# Patient Record
Sex: Male | Born: 1950 | Race: White | Hispanic: No | Marital: Married | Smoking: Former smoker
Health system: Southern US, Community
[De-identification: ages and names within clinical notes are randomized; demographics above are authoritative.]

## PROBLEM LIST (undated history)

## (undated) DIAGNOSIS — M1990 Unspecified osteoarthritis, unspecified site: Secondary | ICD-10-CM

## (undated) DIAGNOSIS — I2510 Atherosclerotic heart disease of native coronary artery without angina pectoris: Secondary | ICD-10-CM

## (undated) DIAGNOSIS — I10 Essential (primary) hypertension: Secondary | ICD-10-CM

## (undated) DIAGNOSIS — I739 Peripheral vascular disease, unspecified: Secondary | ICD-10-CM

## (undated) DIAGNOSIS — M549 Dorsalgia, unspecified: Secondary | ICD-10-CM

## (undated) DIAGNOSIS — J42 Unspecified chronic bronchitis: Secondary | ICD-10-CM

## (undated) DIAGNOSIS — M109 Gout, unspecified: Secondary | ICD-10-CM

## (undated) DIAGNOSIS — E785 Hyperlipidemia, unspecified: Secondary | ICD-10-CM

## (undated) DIAGNOSIS — R0602 Shortness of breath: Secondary | ICD-10-CM

## (undated) DIAGNOSIS — I209 Angina pectoris, unspecified: Secondary | ICD-10-CM

## (undated) DIAGNOSIS — I4510 Unspecified right bundle-branch block: Secondary | ICD-10-CM

## (undated) DIAGNOSIS — N400 Enlarged prostate without lower urinary tract symptoms: Secondary | ICD-10-CM

## (undated) HISTORY — DX: Dorsalgia, unspecified: M54.9

## (undated) HISTORY — DX: Hyperlipidemia, unspecified: E78.5

## (undated) HISTORY — PX: CERVICAL DISC SURGERY: SHX588

## (undated) HISTORY — DX: Essential (primary) hypertension: I10

## (undated) HISTORY — PX: TONSILLECTOMY: SUR1361

---

## 1974-10-12 HISTORY — PX: KNEE SURGERY: SHX244

## 1999-06-13 HISTORY — PX: INGUINAL HERNIA REPAIR: SUR1180

## 2002-01-31 ENCOUNTER — Ambulatory Visit (HOSPITAL_COMMUNITY): Admission: RE | Admit: 2002-01-31 | Discharge: 2002-02-01 | Payer: Self-pay | Admitting: Neurological Surgery

## 2002-01-31 ENCOUNTER — Encounter: Payer: Self-pay | Admitting: Neurological Surgery

## 2012-02-05 ENCOUNTER — Emergency Department (HOSPITAL_COMMUNITY)
Admission: EM | Admit: 2012-02-05 | Discharge: 2012-02-06 | Disposition: A | Discharge status: Home / Self Care | Payer: PRIVATE HEALTH INSURANCE | Attending: Emergency Medicine | Admitting: Emergency Medicine

## 2012-02-05 ENCOUNTER — Encounter (HOSPITAL_COMMUNITY): Payer: Self-pay | Admitting: *Deleted

## 2012-02-05 DIAGNOSIS — Y9229 Other specified public building as the place of occurrence of the external cause: Secondary | ICD-10-CM | POA: Insufficient documentation

## 2012-02-05 DIAGNOSIS — W208XXA Other cause of strike by thrown, projected or falling object, initial encounter: Secondary | ICD-10-CM | POA: Insufficient documentation

## 2012-02-05 DIAGNOSIS — S0101XA Laceration without foreign body of scalp, initial encounter: Secondary | ICD-10-CM

## 2012-02-05 DIAGNOSIS — S0100XA Unspecified open wound of scalp, initial encounter: Secondary | ICD-10-CM | POA: Insufficient documentation
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
## 2012-02-06 MED ORDER — IBUPROFEN 800 MG PO TABS: 800.0000 mg | ORAL_TABLET | Freq: Three times a day (TID) | ORAL | Status: AC

## 2012-02-06 MED ORDER — TETANUS-DIPHTH-ACELL PERTUSSIS 5-2.5-18.5 LF-MCG/0.5 IM SUSP
0.5000 mL | Freq: Once | INTRAMUSCULAR | Status: AC
  Administered 2012-02-06: 0.5 mL via INTRAMUSCULAR
  Filled 2012-02-06: qty 0.5

## 2012-02-06 MED ORDER — IBUPROFEN 800 MG PO TABS
800.0000 mg | ORAL_TABLET | Freq: Once | ORAL | Status: AC
  Administered 2012-02-06: 800 mg via ORAL
  Filled 2012-02-06: qty 1

## 2012-02-06 NOTE — ED Notes (Signed)
Rx x 1, pt voiced understanding to have staples removed in 7-10 days.
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
## 2012-02-06 NOTE — Discharge Instructions (Signed)
You were seen and treated for your head injury. This time your providers feel you have any concerning or emergent injury. The laceration was cleaned and closed with staples. The staples will need to be removed in 7-10 days. You may followup with your primary care provider, urgent care Center or return to the emergency room to have your staples removed. Return sooner if you develop any increasing pain, drainage or bleeding, fever, chills, sweats.   Laceration Care, Adult A laceration is a cut or lesion that goes through all layers of the skin and into the tissue just beneath the skin. TREATMENT  Some lacerations may not require closure. Some lacerations may not be able to be closed due to an increased risk of infection. It is important to see your caregiver as soon as possible after an injury to minimize the risk of infection and maximize the opportunity for successful closure. If closure is appropriate, pain medicines may be given, if needed. The wound will be cleaned to help prevent infection. Your caregiver will use stitches (sutures), staples, wound glue (adhesive), or skin adhesive strips to repair the laceration. These tools bring the skin edges together to allow for faster healing and a better cosmetic outcome. However, all wounds will heal with a scar. Once the wound has healed, scarring can be minimized by covering the wound with sunscreen during the day for 1 full year. HOME CARE INSTRUCTIONS  For sutures or staples:  Keep the wound clean and dry.   If you were given a bandage (dressing), you should change it at least once a day. Also, change the dressing if it becomes wet or dirty, or as directed by your caregiver.   Wash the wound with soap and water 2 times a day. Rinse the wound off with water to remove all soap. Pat the wound dry with a clean towel.   After cleaning, apply a thin layer of the antibiotic ointment as recommended by your caregiver. This will help prevent infection and  keep the dressing from sticking.   You may shower as usual after the first 24 hours. Do not soak the wound in water until the sutures are removed.   Only take over-the-counter or prescription medicines for pain, discomfort, or fever as directed by your caregiver.   Get your sutures or staples removed as directed by your caregiver.  For skin adhesive strips:  Keep the wound clean and dry.   Do not get the skin adhesive strips wet. You may bathe carefully, using caution to keep the wound dry.   If the wound gets wet, pat it dry with a clean towel.   Skin adhesive strips will fall off on their own. You may trim the strips as the wound heals. Do not remove skin adhesive strips that are still stuck to the wound. They will fall off in time.  For wound adhesive:  You may briefly wet your wound in the shower or bath. Do not soak or scrub the wound. Do not swim. Avoid periods of heavy perspiration until the skin adhesive has fallen off on its own. After showering or bathing, gently pat the wound dry with a clean towel.   Do not apply liquid medicine, cream medicine, or ointment medicine to your wound while the skin adhesive is in place. This may loosen the film before your wound is healed.   If a dressing is placed over the wound, be careful not to apply tape directly over the skin adhesive. This may cause the  adhesive to be pulled off before the wound is healed.   Avoid prolonged exposure to sunlight or tanning lamps while the skin adhesive is in place. Exposure to ultraviolet light in the first year will darken the scar.   The skin adhesive will usually remain in place for 5 to 10 days, then naturally fall off the skin. Do not pick at the adhesive film.  You may need a tetanus shot if:  You cannot remember when you had your last tetanus shot.   You have never had a tetanus shot.  If you get a tetanus shot, your arm may swell, get red, and feel warm to the touch. This is common and not a  problem. If you need a tetanus shot and you choose not to have one, there is a rare chance of getting tetanus. Sickness from tetanus can be serious. SEEK MEDICAL CARE IF:   You have redness, swelling, or increasing pain in the wound.   You see a red line that goes away from the wound.   You have yellowish-Walmer fluid (pus) coming from the wound.   You have a fever.   You notice a bad smell coming from the wound or dressing.   Your wound breaks open before or after sutures have been removed.   You notice something coming out of the wound such as wood or glass.   Your wound is on your hand or foot and you cannot move a finger or toe.  SEEK IMMEDIATE MEDICAL CARE IF:   Your pain is not controlled with prescribed medicine.   You have severe swelling around the wound causing pain and numbness or a change in color in your arm, hand, leg, or foot.   Your wound splits open and starts bleeding.   You have worsening numbness, weakness, or loss of function of any joint around or beyond the wound.   You develop painful lumps near the wound or on the skin anywhere on your body.  MAKE SURE YOU:   Understand these instructions.   Will watch your condition.   Will get help right away if you are not doing well or get worse.  Document Released: 09/28/2005 Document Revised: 09/17/2011 Document Reviewed: 03/24/2011 [facility] Patient Information 2012 [place], [place].   RESOURCE GUIDE  Dental Problems  Patients with Medicaid: [facility] [phone] [address]                                           [phone] [address] Phone:  [phone]                                                  Phone:  [phone]  If unable to pay or uninsured, contact:  [facility] or [facility]. to become qualified for the adult dental clinic.  Chronic Pain Problems Contact [facility]  [phone] Patients need to be referred  by their primary care doctor.  Insufficient Money for Medicine Contact United Way:  call "211" or [facility] (610)557-3531.  No Primary Care Doctor Call [facility]  [phone] Other agencies that provide inexpensive medical care    [facility]  [phone]    [facility]  [phone]    [facility]  [phone]    [facility]  [phone]    [facility]  [phone]    [facility]  [phone]  Psychological Services [facility]  [phone] [facility]  [phone] [facility]   [phone] (emergency services [phone])  Substance Abuse Resources [facility]  581-028-2439 [facility] [phone] [facility] [place] [phone] Floydene Flock [phone] Residential & Outpatient Substance Abuse Program  ([phone]  Abuse/Neglect [facility] Child Abuse Hotline 435-482-4415 [facility] Child Abuse Hotline 318-630-1459 (After Hours)  Emergency Shelter [facility] (308)465-1955  Maternity Homes Room at the [place] [facility] [phone])[phone] [facility] [phone]  MRSA Hotline #:   (315) 644-2515    [facility] Resources  [facility] [place]     [employer]                          [facility] Dept. [address] [place]                       [address]      [address]  Blondell Reveal Phone:  [phone]                                   Phone:  [phone]                 Phone:  [phone]  [facility] Phone:  [phone]  [facility] Child Abuse Hotline 352-684-0831 [phone] (After Hours)

## 2012-02-08 NOTE — ED Provider Notes (Signed)
Medical screening examination/treatment/procedure(s) were performed by non-physician practitioner and as supervising physician I was immediately available for consultation/collaboration.  Jasmine Awe, MD 02/08/12 [phone]

## 2013-09-11 DIAGNOSIS — I739 Peripheral vascular disease, unspecified: Secondary | ICD-10-CM

## 2013-09-11 HISTORY — DX: Peripheral vascular disease, unspecified: I73.9

## 2013-10-30 ENCOUNTER — Encounter: Payer: Self-pay | Admitting: *Deleted

## 2013-10-30 ENCOUNTER — Ambulatory Visit (INDEPENDENT_AMBULATORY_CARE_PROVIDER_SITE_OTHER): Payer: PRIVATE HEALTH INSURANCE | Admitting: Cardiology

## 2013-10-30 ENCOUNTER — Other Ambulatory Visit: Payer: Self-pay | Admitting: Cardiology

## 2013-10-30 ENCOUNTER — Other Ambulatory Visit: Payer: Self-pay | Admitting: *Deleted

## 2013-10-30 ENCOUNTER — Encounter (HOSPITAL_COMMUNITY): Payer: Self-pay | Admitting: Pharmacy Technician

## 2013-10-30 VITALS — BP 120/72 | HR 64 | Ht 72.0 in | Wt 191.0 lb

## 2013-10-30 DIAGNOSIS — I10 Essential (primary) hypertension: Secondary | ICD-10-CM | POA: Insufficient documentation

## 2013-10-30 DIAGNOSIS — R079 Chest pain, unspecified: Secondary | ICD-10-CM

## 2013-10-30 DIAGNOSIS — E785 Hyperlipidemia, unspecified: Secondary | ICD-10-CM | POA: Insufficient documentation

## 2013-10-30 DIAGNOSIS — R072 Precordial pain: Secondary | ICD-10-CM

## 2013-10-30 LAB — CBC WITH DIFFERENTIAL/PLATELET
Basophils Absolute: 0 10*3/uL (ref 0.0–0.1)
Basophils Relative: 0.5 % (ref 0.0–3.0)
Eosinophils Absolute: 0.2 10*3/uL (ref 0.0–0.7)
Eosinophils Relative: 2.8 % (ref 0.0–5.0)
HEMATOCRIT: 42 % (ref 39.0–52.0)
Hemoglobin: 14.3 g/dL (ref 13.0–17.0)
LYMPHS ABS: 1.9 10*3/uL (ref 0.7–4.0)
Lymphocytes Relative: 21.2 % (ref 12.0–46.0)
MCHC: 34.1 g/dL (ref 30.0–36.0)
MCV: 95.5 fl (ref 78.0–100.0)
MONOS PCT: 7.8 % (ref 3.0–12.0)
Monocytes Absolute: 0.7 10*3/uL (ref 0.1–1.0)
Neutro Abs: 6 10*3/uL (ref 1.4–7.7)
Neutrophils Relative %: 67.7 % (ref 43.0–77.0)
PLATELETS: 325 10*3/uL (ref 150.0–400.0)
RBC: 4.4 Mil/uL (ref 4.22–5.81)
RDW: 12.4 % (ref 11.5–14.6)
WBC: 8.9 10*3/uL (ref 4.5–10.5)

## 2013-10-30 LAB — BASIC METABOLIC PANEL
BUN: 17 mg/dL (ref 6–23)
CO2: 29 meq/L (ref 19–32)
Calcium: 9.3 mg/dL (ref 8.4–10.5)
Chloride: 103 mEq/L (ref 96–112)
Creatinine, Ser: 0.9 mg/dL (ref 0.4–1.5)
GFR: 85.79 mL/min (ref >60.00)
Glucose, Bld: 86 mg/dL (ref 70–99)
Potassium: 4.7 mEq/L (ref 3.5–5.1)
Sodium: 139 mEq/L (ref 135–145)

## 2013-10-30 LAB — PROTIME-INR
INR: 1.1 ratio — AB (ref 0.8–1.0)
PROTHROMBIN TIME: 11.1 s (ref 10.2–12.4)

## 2013-10-30 NOTE — Assessment & Plan Note (Signed)
Continue statin. 

## 2013-10-30 NOTE — Addendum Note (Signed)
Addended by: Freddi StarrMATHIS, Jeryl Wilbourn W on: 10/30/2013 01:50 PM   Modules accepted: Orders

## 2013-10-30 NOTE — Patient Instructions (Signed)

## 2013-10-30 NOTE — Assessment & Plan Note (Signed)
Blood pressure controlled. Continue present medications. 

## 2013-10-30 NOTE — Progress Notes (Signed)
     HPI: 63 year old male for evaluation of chest pain. [facility]. Echocardiogram in November of 2014 showed normal LV function. Carotid Dopplers in December of 2014 showed less than 39% bilateral stenosis. ABIs in December of 2014 showed no significant stenosis. Exercise treadmill in Dec 2014 showed no ST changes. Patient states that for the past 6 months he has had dyspnea on exertion. No orthopnea, PND, pedal edema, palpitations or syncope. He also has chest pain in the left breast area described as a grabbing pain. It occasionally radiates to his left upper extremity. It occurs with exertion and is relieved with rest. It is not pleuritic nor is it related to food. He has had no symptoms at rest.  Current Outpatient Prescriptions  Medication Sig Dispense Refill  . CRESTOR 20 MG tablet Take 1 tablet by mouth every evening.      . dutasteride (AVODART) 0.5 MG capsule Take 0.5 mg by mouth every other day.      . metoprolol succinate (TOPROL-XL) 50 MG 24 hr tablet Take 11 tablets by mouth daily.       No current facility-administered medications for this visit.    No Known Allergies  Past Medical History  Diagnosis Date  . HLD (hyperlipidemia)   . HTN (hypertension)   . Back pain     Past Surgical History  Procedure Laterality Date  . Cervical disc surgery    . Knee surgery Right   . Tonsillectomy      History   Social History  . Marital Status: Married    Spouse Name: N/A    Number of Children: 3  . Years of Education: N/A   Occupational History  .      Construction   Social History Main Topics  . Smoking status: Former Games developermoker  . Smokeless tobacco: Not on file  . Alcohol Use: Yes     Comment: 2 beers per night  . Drug Use: Not on file  . Sexual Activity: Not on file   Other Topics Concern  . Not on file   Social History Narrative  . No narrative on file    Family History  Problem Relation Age of Onset  . Hypertension Father      ROS: no fevers or chills, productive cough, hemoptysis, dysphasia, odynophagia, melena, hematochezia, dysuria, hematuria, rash, seizure activity, orthopnea, PND, pedal edema, claudication. Remaining systems are negative.  Physical Exam:   Blood pressure 120/72, pulse 64, height 6' (1.829 m), weight 191 lb (86.637 kg).  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

## 2013-10-30 NOTE — Assessment & Plan Note (Addendum)
Symptoms are concerning for angina. Previous cardiac workup has been negative but he has had persistent symptoms. I feel definitive evaluation is warranted based on his description. Plan to proceed with cardiac catheterization. The risks and benefits were discussed and the patient agrees to proceed. Continue aspirin, beta blocker and statin. Patient does travel frequently. If catheterization negative will check d-dimer.

## 2013-11-01 ENCOUNTER — Ambulatory Visit (HOSPITAL_COMMUNITY)
Admission: RE | Admit: 2013-11-01 | Discharge: 2013-11-03 | Disposition: A | Discharge status: Home / Self Care | Payer: PRIVATE HEALTH INSURANCE | Source: Ambulatory Visit | Attending: Cardiovascular Disease | Admitting: Cardiovascular Disease

## 2013-11-01 ENCOUNTER — Encounter (HOSPITAL_COMMUNITY)
Admission: RE | Disposition: A | Discharge status: Home / Self Care | Payer: 59 | Source: Ambulatory Visit | Attending: Cardiovascular Disease

## 2013-11-01 ENCOUNTER — Encounter (HOSPITAL_COMMUNITY): Payer: Self-pay | Admitting: General Practice

## 2013-11-01 DIAGNOSIS — I2510 Atherosclerotic heart disease of native coronary artery without angina pectoris: Secondary | ICD-10-CM | POA: Insufficient documentation

## 2013-11-01 DIAGNOSIS — N400 Enlarged prostate without lower urinary tract symptoms: Secondary | ICD-10-CM | POA: Insufficient documentation

## 2013-11-01 DIAGNOSIS — M549 Dorsalgia, unspecified: Secondary | ICD-10-CM | POA: Insufficient documentation

## 2013-11-01 DIAGNOSIS — Z87891 Personal history of nicotine dependence: Secondary | ICD-10-CM | POA: Insufficient documentation

## 2013-11-01 DIAGNOSIS — I10 Essential (primary) hypertension: Secondary | ICD-10-CM | POA: Insufficient documentation

## 2013-11-01 DIAGNOSIS — I4729 Other ventricular tachycardia: Secondary | ICD-10-CM | POA: Insufficient documentation

## 2013-11-01 DIAGNOSIS — Z955 Presence of coronary angioplasty implant and graft: Secondary | ICD-10-CM

## 2013-11-01 DIAGNOSIS — I739 Peripheral vascular disease, unspecified: Secondary | ICD-10-CM | POA: Insufficient documentation

## 2013-11-01 DIAGNOSIS — E785 Hyperlipidemia, unspecified: Secondary | ICD-10-CM | POA: Insufficient documentation

## 2013-11-01 DIAGNOSIS — R072 Precordial pain: Secondary | ICD-10-CM

## 2013-11-01 DIAGNOSIS — I472 Ventricular tachycardia, unspecified: Secondary | ICD-10-CM | POA: Insufficient documentation

## 2013-11-01 DIAGNOSIS — I200 Unstable angina: Secondary | ICD-10-CM | POA: Insufficient documentation

## 2013-11-01 DIAGNOSIS — R079 Chest pain, unspecified: Secondary | ICD-10-CM

## 2013-11-01 DIAGNOSIS — I4510 Unspecified right bundle-branch block: Secondary | ICD-10-CM | POA: Diagnosis present

## 2013-11-01 HISTORY — DX: Angina pectoris, unspecified: I20.9

## 2013-11-01 HISTORY — DX: Shortness of breath: R06.02

## 2013-11-01 HISTORY — DX: Unspecified osteoarthritis, unspecified site: M19.90

## 2013-11-01 HISTORY — DX: Unspecified right bundle-branch block: I45.10

## 2013-11-01 HISTORY — DX: Atherosclerotic heart disease of native coronary artery without angina pectoris: I25.10

## 2013-11-01 HISTORY — DX: Peripheral vascular disease, unspecified: I73.9

## 2013-11-01 HISTORY — DX: Benign prostatic hyperplasia without lower urinary tract symptoms: N40.0

## 2013-11-01 HISTORY — PX: CORONARY ANGIOPLASTY WITH STENT PLACEMENT: SHX49

## 2013-11-01 HISTORY — PX: LEFT HEART CATHETERIZATION WITH CORONARY ANGIOGRAM: SHX5451

## 2013-11-01 HISTORY — DX: Gout, unspecified: M10.9

## 2013-11-01 HISTORY — DX: Unspecified chronic bronchitis: J42

## 2013-11-01 SURGERY — LEFT HEART CATHETERIZATION WITH CORONARY ANGIOGRAM
Anesthesia: LOCAL

## 2013-11-01 MED ORDER — MIDAZOLAM HCL 2 MG/2ML IJ SOLN
INTRAMUSCULAR | Status: AC
  Filled 2013-11-01: qty 2

## 2013-11-01 MED ORDER — NITROGLYCERIN 0.2 MG/ML ON CALL CATH LAB
INTRAVENOUS | Status: AC
  Filled 2013-11-01: qty 1

## 2013-11-01 MED ORDER — SODIUM CHLORIDE 0.9 % IJ SOLN: 3.0000 mL | INTRAMUSCULAR | Status: DC | PRN

## 2013-11-01 MED ORDER — FENTANYL CITRATE 0.05 MG/ML IJ SOLN
INTRAMUSCULAR | Status: AC
  Filled 2013-11-01: qty 2

## 2013-11-01 MED ORDER — ASPIRIN 81 MG PO CHEW: 81.0000 mg | CHEWABLE_TABLET | ORAL | Status: DC

## 2013-11-01 MED ORDER — DIAZEPAM 5 MG PO TABS: 5.0000 mg | ORAL_TABLET | ORAL | Status: DC

## 2013-11-01 MED ORDER — SODIUM CHLORIDE 0.9 % IV SOLN: INTRAVENOUS | Status: DC

## 2013-11-01 MED ORDER — SODIUM CHLORIDE 0.9 % IV SOLN
0.2500 mg/kg/h | INTRAVENOUS | Status: AC
  Filled 2013-11-01: qty 250

## 2013-11-01 MED ORDER — HEPARIN SODIUM (PORCINE) 1000 UNIT/ML IJ SOLN
INTRAMUSCULAR | Status: AC
  Filled 2013-11-01: qty 1

## 2013-11-01 MED ORDER — MORPHINE SULFATE 10 MG/ML IJ SOLN
INTRAMUSCULAR | Status: AC
  Filled 2013-11-01: qty 1

## 2013-11-01 MED ORDER — LIDOCAINE HCL (PF) 1 % IJ SOLN
INTRAMUSCULAR | Status: AC
  Filled 2013-11-01: qty 30

## 2013-11-01 MED ORDER — GI COCKTAIL ~~LOC~~
30.0000 mL | Freq: Once | ORAL | Status: AC
  Administered 2013-11-02: 30 mL via ORAL
  Filled 2013-11-01: qty 30

## 2013-11-01 MED ORDER — ATORVASTATIN CALCIUM 40 MG PO TABS
40.0000 mg | ORAL_TABLET | Freq: Every day | ORAL | Status: DC
  Administered 2013-11-01 – 2013-11-02 (×2): 40 mg via ORAL
  Filled 2013-11-01 (×4): qty 1

## 2013-11-01 MED ORDER — NITROGLYCERIN IN D5W 200-5 MCG/ML-% IV SOLN
2.0000 ug/min | INTRAVENOUS | Status: DC
  Administered 2013-11-01: 10 ug/min via INTRAVENOUS

## 2013-11-01 MED ORDER — ASPIRIN 81 MG PO CHEW
CHEWABLE_TABLET | ORAL | Status: AC
  Filled 2013-11-01: qty 4

## 2013-11-01 MED ORDER — SODIUM CHLORIDE 0.9 % IV SOLN: 250.0000 mL | INTRAVENOUS | Status: DC | PRN

## 2013-11-01 MED ORDER — HEPARIN (PORCINE) IN NACL 2-0.9 UNIT/ML-% IJ SOLN
INTRAMUSCULAR | Status: AC
  Filled 2013-11-01: qty 1000

## 2013-11-01 MED ORDER — CLOPIDOGREL BISULFATE 75 MG PO TABS
75.0000 mg | ORAL_TABLET | Freq: Every day | ORAL | Status: DC
  Administered 2013-11-02 – 2013-11-03 (×2): 75 mg via ORAL
  Filled 2013-11-01 (×2): qty 1

## 2013-11-01 MED ORDER — ASPIRIN 81 MG PO CHEW
81.0000 mg | CHEWABLE_TABLET | Freq: Every day | ORAL | Status: DC
  Administered 2013-11-02 – 2013-11-03 (×2): 81 mg via ORAL
  Filled 2013-11-01 (×3): qty 1

## 2013-11-01 MED ORDER — ACETAMINOPHEN 325 MG PO TABS
650.0000 mg | ORAL_TABLET | ORAL | Status: DC | PRN
  Administered 2013-11-02: 05:00:00 650 mg via ORAL
  Filled 2013-11-01: qty 2

## 2013-11-01 MED ORDER — ONDANSETRON HCL 4 MG/2ML IJ SOLN: 4.0000 mg | Freq: Four times a day (QID) | INTRAMUSCULAR | Status: DC | PRN

## 2013-11-01 MED ORDER — CLOPIDOGREL BISULFATE 75 MG PO TABS
ORAL_TABLET | ORAL | Status: AC
  Filled 2013-11-01: qty 8

## 2013-11-01 MED ORDER — VERAPAMIL HCL 2.5 MG/ML IV SOLN
INTRAVENOUS | Status: AC
  Filled 2013-11-01: qty 2

## 2013-11-01 MED ORDER — SODIUM CHLORIDE 0.9 % IV SOLN
1.0000 mL/kg/h | INTRAVENOUS | Status: DC
  Administered 2013-11-01: 1 mL/kg/h via INTRAVENOUS

## 2013-11-01 MED ORDER — SODIUM CHLORIDE 0.9 % IV SOLN
INTRAVENOUS | Status: AC
Start: 2013-11-01 — End: 2013-11-01
  Administered 2013-11-01: 13:00:00 via INTRAVENOUS

## 2013-11-01 MED ORDER — BIVALIRUDIN 250 MG IV SOLR
INTRAVENOUS | Status: AC
  Filled 2013-11-01: qty 250

## 2013-11-01 MED ORDER — METOPROLOL SUCCINATE ER 50 MG PO TB24
50.0000 mg | ORAL_TABLET | Freq: Every day | ORAL | Status: DC
  Administered 2013-11-02: 50 mg via ORAL
  Filled 2013-11-01 (×2): qty 1

## 2013-11-01 MED ORDER — SODIUM CHLORIDE 0.9 % IJ SOLN: 3.0000 mL | Freq: Two times a day (BID) | INTRAMUSCULAR | Status: DC

## 2013-11-01 MED ORDER — NITROGLYCERIN IN D5W 200-5 MCG/ML-% IV SOLN
INTRAVENOUS | Status: AC
  Filled 2013-11-01: qty 250

## 2013-11-01 NOTE — H&P (View-Only) (Signed)
     HPI: 63 year old male for evaluation of chest pain. [facility]. Echocardiogram in November of 2014 showed normal LV function. Carotid Dopplers in December of 2014 showed less than 39% bilateral stenosis. ABIs in December of 2014 showed no significant stenosis. Exercise treadmill in Dec 2014 showed no ST changes. Patient states that for the past 6 months he has had dyspnea on exertion. No orthopnea, PND, pedal edema, palpitations or syncope. He also has chest pain in the left breast area described as a grabbing pain. It occasionally radiates to his left upper extremity. It occurs with exertion and is relieved with rest. It is not pleuritic nor is it related to food. He has had no symptoms at rest.  Current Outpatient Prescriptions  Medication Sig Dispense Refill  . CRESTOR 20 MG tablet Take 1 tablet by mouth every evening.      . dutasteride (AVODART) 0.5 MG capsule Take 0.5 mg by mouth every other day.      . metoprolol succinate (TOPROL-XL) 50 MG 24 hr tablet Take 11 tablets by mouth daily.       No current facility-administered medications for this visit.    No Known Allergies  Past Medical History  Diagnosis Date  . HLD (hyperlipidemia)   . HTN (hypertension)   . Back pain     Past Surgical History  Procedure Laterality Date  . Cervical disc surgery    . Knee surgery Right   . Tonsillectomy      History   Social History  . Marital Status: Married    Spouse Name: N/A    Number of Children: 3  . Years of Education: N/A   Occupational History  .      Construction   Social History Main Topics  . Smoking status: Former Games developermoker  . Smokeless tobacco: Not on file  . Alcohol Use: Yes     Comment: 2 beers per night  . Drug Use: Not on file  . Sexual Activity: Not on file   Other Topics Concern  . Not on file   Social History Narrative  . No narrative on file    Family History  Problem Relation Age of Onset  . Hypertension Father      ROS: no fevers or chills, productive cough, hemoptysis, dysphasia, odynophagia, melena, hematochezia, dysuria, hematuria, rash, seizure activity, orthopnea, PND, pedal edema, claudication. Remaining systems are negative.  Physical Exam:   Blood pressure 120/72, pulse 64, height 6' (1.829 m), weight 191 lb (86.637 kg).  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

## 2013-11-01 NOTE — Interval H&P Note (Signed)
History and Physical Interval Note:  11/01/2013 10:45 AM  Juan RackJoseph Henderson  has presented today for cardiac cath with the diagnosis of Chest pain  The various methods of treatment have been discussed with the patient and family. After consideration of risks, benefits and other options for treatment, the patient has consented to  Procedure(s): LEFT HEART CATHETERIZATION WITH CORONARY ANGIOGRAM (N/A) as a surgical intervention .  The patient's history has been reviewed, patient examined, no change in status, stable for surgery.  I have reviewed the patient's chart and labs.  Questions were answered to the patient's satisfaction.    Cath Lab Visit (complete for each Cath Lab visit)  Clinical Evaluation Leading to the Procedure:   ACS: no  Non-ACS:    Anginal Classification: CCS III  Anti-ischemic medical therapy: Minimal Therapy (1 class of medications)  Non-Invasive Test Results: No non-invasive testing performed  Prior CABG: No previous CABG         MCALHANY,CHRISTOPHER

## 2013-11-01 NOTE — CV Procedure (Signed)
Cardiac Catheterization Operative Report  Juan Henderson [id number] 1/21/201512:24 PM Karle Plumber, MD  Procedure Performed:  1. Left Heart Catheterization 2. Selective Coronary Angiography 3. Left ventricular angiogram 4. PTCA/DES x 1 mid RCA 5. PTCA/DES x 1 mid Circumflex/OM2  Operator: Verne Carrow, MD  Arterial access site:  Right radial artery.   Indication:   63 yo male with history of HTN, HLD with recent chest pain with minimal exertion c/w class III, unstable angina.                                   Procedure Details: The risks, benefits, complications, treatment options, and expected outcomes were discussed with the patient. The patient and/or family concurred with the proposed plan, giving informed consent. The patient was brought to the cath lab after IV hydration was begun and oral premedication was given. The patient was further sedated with Versed and Fentanyl. The right wrist was assessed with an Allens test which was positive. The right wrist was prepped and draped in a sterile fashion. 1% lidocaine was used for local anesthesia. Using the modified Seldinger access technique, a 5/6 French sheath was placed in the right radial artery. 3 mg Verapamil was given through the sheath. 4000 units IV heparin was given. Standard diagnostic catheters were used to perform selective coronary angiography. A pigtail catheter was used to perform a left ventricular angiogram. The patient was found to have severe stenosis in the mid RCA and in the mid Circumflex artery involving the second OM branch and the distal AV groove Circumflex. I elected to perform PCI of the RCA and Circumflex.   PCI Note: The patient was given 324 mg ASA x 1 po, Plavix 600 mg po x 1. Angiomax bolus given and a drip started.   Lesion #1: The RCA was engaged with a JR4 guiding catheter. When the ACT was over 200, I passed a Cougar IC wire down the RCA. I pre-dilated the severe mid RCA stenosis with  a 2.5 x 12 mm balloon x 1. I then carefully positioned and deployed a 3.0 x 16 mm Promus Premier DES in the mid RCA. The stent was post-dilated with a 3.25 x 12 mm [place] balloon x 1. The hazy 90% stenosis was taken to 0%.   Lesion #2: The left main was engaged with a XB 3.0 guiding catheter. I then passed a Cougar IC wire down the Circumflex into the second OM branch. The severe stenosis started prior to the takeoff of the second OM branch. Just distal to the OM branch, the mid AV groove Circumflex has 100% chronic occlusion and fills from left to left collaterals. The occlusion in the mid Circumflex appeared chronic but I did try to pass a Cougar IC wire down the AV groove Circumflex distally but the wire would not pass. I also tried to pass a Whisper wire down the AV groove Circumflex but could not pass this wire either. I then used a 2.0 x 12 mm balloon to pre-dilate the mid AV groove Circumflex extending into the OM2. A 2.25 x 16 mm Promus Premier DES was deployed in the mid vessel extending into the OM2. The stent was post-dilated with a 2.25 x 12 mm [place] balloon x 1. The stenosis was taken from 90% down to 0%. Of note, the distal AV groove segment lost all antegrade flow after the stent placement into the OM2. The  distal vessel was seen to fill from left to left collaterals.   The sheath was removed from the right radial artery and a Terumo hemostasis band was applied at the arteriotomy site on the right wrist.    There were no immediate complications. The patient was taken to the recovery area in stable condition.   Hemodynamic Findings: Central aortic pressure: 120/72 Left ventricular pressure: 116/3/13  Angiographic Findings:  Left main: No obstructive disease.   Left Anterior Descending Artery: Large caliber vessel that courses to the apex. 40% mid stenosis. The diagonal branch is moderate in caliber with mild ostial plaque.   Circumflex Artery: Large caliber vessel proximally with moderate  caliber first OM branch, small to moderate caliber second OM branch. The proximal vessel has diffuse 20% stenosis. The mid AV groove Circumflex has a hazy 80-90% stenosis just before the takeoff of OM2. The AV groove Circumflex tapers to become a very small caliber vessel. Just beyond the takeoff of OM2, there is 100% chronic appearing occlusion. The distal vessel fills from bridging collaterals. The first OM branch is moderate in caliber with mild plaque disease. The second OM branch has ostial 90% stenosis.   Right Coronary Artery: Large dominant vessel with 90%, hazy mid stenosis.   Left Ventricular Angiogram: LVEF=65%. No MR  Impression: 1. 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  2. Successful PTCA/DES x 1 mid RCA 3. Successful PTCA/DES x 1 mid Circumflex/OM2 4. Unstable angina  Recommendations: Will run Angiomax at a reduced dose for 2 hours. He will need lifelong ASA and Plavix if he tolerates. Continue statin and beta blocker. Wean NTG drip to off this afternoon.        Complications:  None. The patient tolerated the procedure well.

## 2013-11-02 DIAGNOSIS — I200 Unstable angina: Secondary | ICD-10-CM

## 2013-11-02 DIAGNOSIS — I2510 Atherosclerotic heart disease of native coronary artery without angina pectoris: Secondary | ICD-10-CM

## 2013-11-02 HISTORY — DX: Atherosclerotic heart disease of native coronary artery without angina pectoris: I25.10

## 2013-11-02 LAB — CBC
HEMATOCRIT: 37.7 % — AB (ref 39.0–52.0)
HEMOGLOBIN: 12.6 g/dL — AB (ref 13.0–17.0)
MCH: 32.4 pg (ref 26.0–34.0)
MCHC: 33.4 g/dL (ref 30.0–36.0)
MCV: 96.9 fL (ref 78.0–100.0)
Platelets: 285 10*3/uL (ref 150–400)
RBC: 3.89 MIL/uL — AB (ref 4.22–5.81)
RDW: 12.4 % (ref 11.5–15.5)
WBC: 15.1 10*3/uL — ABNORMAL HIGH (ref 4.0–10.5)

## 2013-11-02 LAB — BASIC METABOLIC PANEL
BUN: 14 mg/dL (ref 6–23)
CHLORIDE: 104 meq/L (ref 96–112)
CO2: 23 meq/L (ref 19–32)
CREATININE: 0.76 mg/dL (ref 0.50–1.35)
Calcium: 8.9 mg/dL (ref 8.4–10.5)
GFR calc non Af Amer: >90 mL/min (ref >90)
Glucose, Bld: 100 mg/dL — ABNORMAL HIGH (ref 70–99)
POTASSIUM: 4 meq/L (ref 3.7–5.3)
Sodium: 140 mEq/L (ref 137–147)

## 2013-11-02 LAB — POCT ACTIVATED CLOTTING TIME: ACTIVATED CLOTTING TIME: 586 s

## 2013-11-02 MED FILL — Sodium Chloride IV Soln 0.9%: INTRAVENOUS | Qty: 50 | Status: AC

## 2013-11-02 NOTE — Progress Notes (Signed)
     SUBJECTIVE: Chest pain is better this am. Still with slight discomfort in his left chest. No SOB. 2 short runs of NSVT overnight.   Tele: sinus  BP 94/59  Pulse 56  Temp(Src) 98.3 F (36.8 C) (Oral)  Resp 20  Ht 6' (1.829 m)  Wt 189 lb 13.1 oz (86.1 kg)  BMI 25.74 kg/m2  SpO2 98%  Intake/Output Summary (Last 24 hours) at 11/02/13 16100811 Last data filed at 11/02/13 0600  Gross per 24 hour  Intake 1053.15 ml  Output   1115 ml  Net -61.85 ml    PHYSICAL EXAM General: Well developed, well nourished, in no acute distress. Alert and oriented x 3.  Psych:  Good affect, responds appropriately Neck: No JVD. No masses noted.  Lungs: Clear bilaterally with no wheezes or rhonci noted.  Heart: RRR with no murmurs noted. Abdomen: Bowel sounds are present. Soft, non-tender.  Extremities: No lower extremity edema.   LABS: Basic Metabolic Panel:  Recent Labs  96/01/5400/19/15 1157 11/02/13 0436  NA 139 140  K 4.7 4.0  CL 103 104  CO2 29 23  GLUCOSE 86 100*  BUN 17 14  CREATININE 0.9 0.76  CALCIUM 9.3 8.9   CBC:  Recent Labs  10/30/13 1157 11/02/13 0436  WBC 8.9 15.1*  NEUTROABS 6.0  --   HGB 14.3 12.6*  HCT 42.0 37.7*  MCV 95.5 96.9  PLT 325.0 285   Current Meds: . aspirin  81 mg Oral Daily  . atorvastatin  40 mg Oral q1800  . clopidogrel  75 mg Oral Q breakfast  . metoprolol succinate  50 mg Oral Daily     ASSESSMENT AND PLAN:  1. CAD/Unstable angina: Pt is s/p PCI of the RCA and Circumflex 11/01/13. A DES was placed in the mid RCA and a DES was placed in the mid Circumflex into the OM2. The distal AV groove Circumflex beyond the OM2 was chronically occluded and filling via collaterals at the start of the case and flow was affected into this small distal branch after PCI, although the vessel was starting to fill retrograde from distal collaterals at completion of the case. He is on ASA/Plavix, beta blocker and statin. Will stop IV NTG. Ambulate today. Will monitor  24 more hours with several short runs of VT last night.   Juan Henderson  1/22/20158:11 AM

## 2013-11-02 NOTE — Progress Notes (Signed)
CARDIAC REHAB PHASE I   PRE:  Rate/Rhythm: 55 SB with PAC's  BP:  Supine: 113/50  Sitting:   Standing:    SaO2:   MODE:  Ambulation: 1000 ft   POST:  Rate/Rhythm: 60 SR  BP:  Supine:   Sitting: 143/77  Standing:    SaO2:  0750-0920 Assisted X 1 to ambulate. Gait steady. Pt feels some discomfort in his back and is belching. He states that walking felt good, no increase in symptoms with walking. VS stable . Pt to recliner after walk with call light in reach. Completed stent discharge education with pt wife and daughter. They voice understanding. Pt agrees to Outpt. [facility] in [place], will send referral. Answered their questions.  Melina CopaLisa Tia Hieronymus RN 11/02/2013 9:30 AM

## 2013-11-03 ENCOUNTER — Encounter (HOSPITAL_COMMUNITY): Payer: Self-pay | Admitting: Cardiology

## 2013-11-03 DIAGNOSIS — I739 Peripheral vascular disease, unspecified: Secondary | ICD-10-CM | POA: Diagnosis present

## 2013-11-03 DIAGNOSIS — R079 Chest pain, unspecified: Secondary | ICD-10-CM

## 2013-11-03 DIAGNOSIS — R072 Precordial pain: Secondary | ICD-10-CM

## 2013-11-03 DIAGNOSIS — Z9861 Coronary angioplasty status: Secondary | ICD-10-CM

## 2013-11-03 DIAGNOSIS — I4729 Other ventricular tachycardia: Secondary | ICD-10-CM | POA: Diagnosis present

## 2013-11-03 DIAGNOSIS — N400 Enlarged prostate without lower urinary tract symptoms: Secondary | ICD-10-CM | POA: Diagnosis present

## 2013-11-03 DIAGNOSIS — I10 Essential (primary) hypertension: Secondary | ICD-10-CM

## 2013-11-03 DIAGNOSIS — I472 Ventricular tachycardia: Secondary | ICD-10-CM | POA: Diagnosis present

## 2013-11-03 DIAGNOSIS — E785 Hyperlipidemia, unspecified: Secondary | ICD-10-CM

## 2013-11-03 DIAGNOSIS — I4510 Unspecified right bundle-branch block: Secondary | ICD-10-CM | POA: Diagnosis present

## 2013-11-03 LAB — BASIC METABOLIC PANEL
BUN: 15 mg/dL (ref 6–23)
CALCIUM: 9.1 mg/dL (ref 8.4–10.5)
CO2: 25 mEq/L (ref 19–32)
CREATININE: 0.85 mg/dL (ref 0.50–1.35)
Chloride: 104 mEq/L (ref 96–112)
GFR calc Af Amer: >90 mL/min (ref >90)
GFR calc non Af Amer: >90 mL/min (ref >90)
GLUCOSE: 92 mg/dL (ref 70–99)
Potassium: 4.6 mEq/L (ref 3.7–5.3)
SODIUM: 141 meq/L (ref 137–147)

## 2013-11-03 LAB — CBC
HCT: 39.1 % (ref 39.0–52.0)
Hemoglobin: 13.1 g/dL (ref 13.0–17.0)
MCH: 32.4 pg (ref 26.0–34.0)
MCHC: 33.5 g/dL (ref 30.0–36.0)
MCV: 96.8 fL (ref 78.0–100.0)
PLATELETS: 284 10*3/uL (ref 150–400)
RBC: 4.04 MIL/uL — ABNORMAL LOW (ref 4.22–5.81)
RDW: 12.3 % (ref 11.5–15.5)
WBC: 8.8 10*3/uL (ref 4.0–10.5)

## 2013-11-03 MED ORDER — CLOPIDOGREL BISULFATE 75 MG PO TABS: 75.0000 mg | ORAL_TABLET | Freq: Every day | ORAL | Status: DC

## 2013-11-03 MED ORDER — NITROGLYCERIN 0.4 MG SL SUBL: 0.4000 mg | SUBLINGUAL_TABLET | SUBLINGUAL | Status: DC | PRN

## 2013-11-03 MED ORDER — ACETAMINOPHEN 325 MG PO TABS: 650.0000 mg | ORAL_TABLET | ORAL | Status: AC | PRN

## 2013-11-03 NOTE — Progress Notes (Signed)
    Patient ID: Juan Henderson, male   DOB: 1951-04-07, 63 y.o.   MRN: [id number]  SUBJECTIVE: Chest pain is better this am. Still with slight discomfort in his left chest. No SOB. 2 short runs of NSVT overnight.   Tele: sinus - no further VT. Rates in 50s-60s  BP 95/51  Pulse 60  Temp(Src) 97.6 F (36.4 C) (Oral)  Resp 18  Ht 6' (1.829 m)  Wt 183 lb 13.8 oz (83.4 kg)  BMI 24.93 kg/m2  SpO2 97%  Intake/Output Summary (Last 24 hours) at 11/03/13 0704 Last data filed at 11/02/13 1900  Gross per 24 hour  Intake    660 ml  Output    801 ml  Net   -141 ml    PHYSICAL EXAM General: Well developed, well nourished, in no acute distress. Alert and oriented x 3.  Psych:  Good affect, responds appropriately Neck: No JVD. No masses noted.  Lungs: Clear bilaterally with no wheezes or rhonci noted.  Heart: RRR with no murmurs noted. Abdomen: Soft, non-tender. NABS Extremities: No C/C/E   LABS: Basic Metabolic Panel:  Recent Labs  40/98/1101/22/15 0436  NA 140  K 4.0  CL 104  CO2 23  GLUCOSE 100*  BUN 14  CREATININE 0.76  CALCIUM 8.9   CBC:  Recent Labs  11/02/13 0436 11/03/13 0600  WBC 15.1* 8.8  HGB 12.6* 13.1  HCT 37.7* 39.1  MCV 96.9 96.8  PLT 285 284   Current Meds: . aspirin  81 mg Oral Daily  . atorvastatin  40 mg Oral q1800  . clopidogrel  75 mg Oral Q breakfast  . metoprolol succinate  50 mg Oral Daily    ASSESSMENT AND PLAN: Principal Problem:   Unstable angina Active Problems:   Coronary atherosclerosis of native coronary artery   Presence of drug coated stent in Left Circumflex & Right Coronary artery   Hyperlipidemia   Essential hypertension  CAD/Unstable angina: Pt is s/p PCI of the RCA and Circumflex 11/01/13. A DES was placed in the mid RCA and a DES was placed in the mid Circumflex into the OM2. The distal AV groove Circumflex beyond the OM2 was chronically occluded and filling via collaterals at the start of the case and flow was affected into this  small distal branch after PCI, although the vessel was starting to fill retrograde from distal collaterals at completion of the case. He is on ASA/Plavix, beta blocker and statin.  NTG weaned off.  No further VT episodes.  Ambulated in hall several times yesterday - no problems since yest AM.  Pending walk today - is ready for d/c home -- ROV with Dr. Jens Somrenshaw.   Juan Henderson W  1/23/20157:04 AM

## 2013-11-03 NOTE — Discharge Summary (Signed)
I saw & examined the patient this AM along with Mr. Juan Henderson. He was stable overnight with no further arrythmia or chest pain since yesterday AM. He has ambulated in the halls several times without anginal pain. He is hemodynamically stable & ready for discharge.  I agree with the summary note.  Marykay LexHARDING,DAVID W, MD Marykay LexHARDING,DAVID W, M.D., M.S. [facility] [address] [place], [place]  [id number]  [phone] Pager # [phone] 11/03/2013 10:33 PM

## 2013-11-03 NOTE — Discharge Instructions (Signed)
Radial Site Care °Refer to this sheet in the next few weeks. These instructions provide you with information on caring for yourself after your procedure. Your caregiver may also give you more specific instructions. Your treatment has been planned according to current medical practices, but problems sometimes occur. Call your caregiver if you have any problems or questions after your procedure. °HOME CARE INSTRUCTIONS °· You may shower the day after the procedure. Remove the bandage (dressing) and gently wash the site with plain soap and water. Gently pat the site dry. °· Do not apply powder or lotion to the site. °· Do not submerge the affected site in water for 3 to 5 days. °· Inspect the site at least twice daily. °· Do not flex or bend the affected arm for 24 hours. °· No lifting over 5 pounds (2.3 kg) for 5 days after your procedure. °· Do not drive home if you are discharged the same day of the procedure. Have someone else drive you. °· You may drive 24 hours after the procedure unless otherwise instructed by your caregiver. °· Do not operate machinery or power tools for 24 hours. °· A responsible adult should be with you for the first 24 hours after you arrive home. °What to expect: °· Any bruising will usually fade within 1 to 2 weeks. °· Blood that collects in the tissue (hematoma) may be painful to the touch. It should usually decrease in size and tenderness within 1 to 2 weeks. °SEEK IMMEDIATE MEDICAL CARE IF: °· You have unusual pain at the radial site. °· You have redness, warmth, swelling, or pain at the radial site. °· You have drainage (other than a small amount of blood on the dressing). °· You have chills. °· You have a fever or persistent symptoms for more than 72 hours. °· You have a fever and your symptoms suddenly get worse. °· Your arm becomes pale, cool, tingly, or numb. °· You have heavy bleeding from the site. Hold pressure on the site. °Document Released: 10/31/2010 Document Revised:  12/21/2011 Document Reviewed: 10/31/2010 °ExitCare® Patient Information ©2014 [employer]. °Coronary Angiography with Stent °Coronary angiography with stent placement is a procedure to widen or open a narrow blood vessel of the heart (coronary artery). When a coronary artery becomes partially blocked, it decreases blood flow to that area. This may lead to chest pain or a heart attack (myocardial infarction). Arteries may become blocked by cholesterol buildup (plaque) in the lining or wall.  °A stent is a small piece of metal that looks like a mesh or a spring. Stent placement may be done right after a coronary angiography in which a blocked artery is found or as a treatment for a heart attack.  °LET YOUR HEALTH CARE PROVIDER KNOW ABOUT: °· Any allergies you have.   °· All medicines you are taking, including vitamins, herbs, eye drops, creams, and over-the-counter medicines.   °· Previous problems you or members of your family have had with the use of anesthetics.   °· Any blood disorders you have.   °· Previous surgeries you have had.   °· Medical conditions you have. °RISKS AND COMPLICATIONS °Generally, coronary angiography with stent is a safe procedure. However, as with any procedure, complications can occur. Possible complications include:  °· Damage to the heart or its blood vessels.   °· A return of blockage.   °· Bleeding at the site.   °· Blood clot in another part of the body.   °· Kidney injury.   °· Allergic reaction to the dye or contrast used.   °  BEFORE THE PROCEDURE °· Do not eat or drink anything for 6 hours before the procedure.   °· Ask your health care provider if medicines can be taken with a sip of water.   °· Your health care provider will make sure you understand the procedure and the risks and potential complications associated with the procedure.   °PROCEDURE °· You may be given a medicine to help you relax before and during the procedure (sedative). This medicine will be given through an  IV tube that is put into one of your veins.   °· The area where the catheter will be inserted is shaved and cleaned. This is usually done in the groin but may be done in the fold of your arm (near your elbow) or in the wrist.    °· A medicine will be given to numb the area where the catheter will be inserted (local anesthetic).   °· The catheter is inserted into an artery using a guide wire. A type of X-ray (fluoroscopy) is used to help guide the catheter to the opening of the blocked artery.   °· A dye is then injected into the catheter, and X-rays are taken. The dye helps to show where any narrowing or blockages are located in the heart arteries.   °· A tiny wire is guided to the blocked spot, and a balloon is inflated to make the artery wider. The stent is expanded and crushes the plaque into the wall of the vessel. The stent holds the area open like a scaffolding and improves the blood flow.   °· Sometimes the artery may be made wider using a laser or other tools to remove plaque.   °· When the blood flow is better, the catheter is removed. The lining of the artery will grow over the stent, which stays where it was placed.   °AFTER THE PROCEDURE °· If the procedure is done through the leg, you will be kept in bed lying flat for about 6 hours. You will be instructed to not bend or cross your legs.   °· The insertion site will be checked frequently.   °· The pulse in your feet or wrist will be checked frequently.   °· Additional blood tests, X-rays, and electrocardiography may be done. °Document Released: 04/04/2003 Document Revised: 07/19/2013 Document Reviewed: 04/06/2013 °ExitCare® Patient Information ©2014 [employer]. ° °

## 2013-11-03 NOTE — Discharge Summary (Signed)
Patient ID: Juan Henderson,  MRN: [id number], DOB/AGE: October 14, 1950 63 y.o.  Admit date: 11/01/2013 Discharge date: 11/03/2013  Primary Care Provider: Dr [facility] Primary Cardiologist: Dr Stanford Breed  Discharge Diagnoses Principal Problem:   Unstable angina Active Problems:   CAD-RCA/OM DES 11/02/13   Essential hypertension   Hyperlipidemia   NSVT-post PCI   PVD (peripheral vascular disease)-<39% Bilat ICA   BPH (benign prostatic hyperplasia)   RBBB    Procedures: Cath/ PCI/ DES x 2 Nov 02 2013   Hospital Course:  63 year old male seen by Dr Stanford Breed Jan 19 for evaluation of chest pain and dyspnea. Echocardiogram in November of 2014 showed normal LV function. Carotid Dopplers in December of 2014 showed less than 39% bilateral stenosis. ABIs in December of 2014 showed no significant stenosis. Exercise treadmill in Dec 2014 showed no ST changes. His symptoms were worrisome for new, accelerating angina and he was admitted for an elective cath 11/01/13. This revealed two vessel CAD with severe stenosis mid RCA, severe complex disease in the mid Circumflex involving the second OM branch with chronic total occlusion of the distal AV groove Circumflex. He underwent PCI/ DES to the RCA and MID CFX/ OM2. He tolerated the procedure well. He had some chest pain in the first 24 hrs post PCI which was felt to be secondary to competitive flow to the re perfused CFX. Marland Kitchen He did have 11 bts of NSVT post PCI which we felt was secondary to reperfusion as well. He is stable for discharge 11/03/13 and will follow up with Dr Stanford Breed in 2 weeks.      Discharge Vitals:  Blood pressure 124/75, pulse 58, temperature 97.7 F (36.5 C), temperature source Oral, resp. rate 18, height 6' (1.829 m), weight 183 lb 13.8 oz (83.4 kg), SpO2 97.00%.    Labs: Results for orders placed during the hospital encounter of 11/01/13 (from the past 48 hour(s))  POCT ACTIVATED CLOTTING TIME     Status: None   Collection  Time    11/01/13 11:39 AM      Result Value Range   Activated Clotting Time 586    CBC     Status: Abnormal   Collection Time    11/02/13  4:36 AM      Result Value Range   WBC 15.1 (*) 4.0 - 10.5 K/uL   RBC 3.89 (*) 4.22 - 5.81 MIL/uL   Hemoglobin 12.6 (*) 13.0 - 17.0 g/dL   HCT 37.7 (*) 39.0 - 52.0 %   MCV 96.9  78.0 - 100.0 fL   MCH 32.4  26.0 - 34.0 pg   MCHC 33.4  30.0 - 36.0 g/dL   RDW 12.4  11.5 - 15.5 %   Platelets 285  150 - 400 K/uL  BASIC METABOLIC PANEL     Status: Abnormal   Collection Time    11/02/13  4:36 AM      Result Value Range   Sodium 140  137 - 147 mEq/L   Potassium 4.0  3.7 - 5.3 mEq/L   Chloride 104  96 - 112 mEq/L   CO2 23  19 - 32 mEq/L   Glucose, Bld 100 (*) 70 - 99 mg/dL   BUN 14  6 - 23 mg/dL   Creatinine, Ser 0.76  0.50 - 1.35 mg/dL   Calcium 8.9  8.4 - 10.5 mg/dL   GFR calc non Af Amer >90  >90 mL/min   GFR calc Af Amer >90  >90 mL/min  Comment: (NOTE)     The eGFR has been calculated using the CKD EPI equation.     This calculation has not been validated in all clinical situations.     eGFR's persistently <90 mL/min signify possible Chronic Kidney     Disease.  BASIC METABOLIC PANEL     Status: None   Collection Time    11/03/13  6:00 AM      Result Value Range   Sodium 141  137 - 147 mEq/L   Potassium 4.6  3.7 - 5.3 mEq/L   Chloride 104  96 - 112 mEq/L   CO2 25  19 - 32 mEq/L   Glucose, Bld 92  70 - 99 mg/dL   BUN 15  6 - 23 mg/dL   Creatinine, Ser 0.85  0.50 - 1.35 mg/dL   Calcium 9.1  8.4 - 10.5 mg/dL   GFR calc non Af Amer >90  >90 mL/min   GFR calc Af Amer >90  >90 mL/min   Comment: (NOTE)     The eGFR has been calculated using the CKD EPI equation.     This calculation has not been validated in all clinical situations.     eGFR's persistently <90 mL/min signify possible Chronic Kidney     Disease.  CBC     Status: Abnormal   Collection Time    11/03/13  6:00 AM      Result Value Range   WBC 8.8  4.0 - 10.5 K/uL   RBC  4.04 (*) 4.22 - 5.81 MIL/uL   Hemoglobin 13.1  13.0 - 17.0 g/dL   HCT 39.1  39.0 - 52.0 %   MCV 96.8  78.0 - 100.0 fL   MCH 32.4  26.0 - 34.0 pg   MCHC 33.5  30.0 - 36.0 g/dL   RDW 12.3  11.5 - 15.5 %   Platelets 284  150 - 400 K/uL    Disposition:  Follow-up Information   Follow up with Kirk Ruths, MD. (office will call you)    Specialty:  Cardiology   Contact information:   [address] [place] [address] [phone]       Discharge Medications:    Medication List         acetaminophen 325 MG tablet  Commonly known as:  TYLENOL  Take 2 tablets (650 mg total) by mouth every 4 (four) hours as needed for headache or mild pain.     aspirin EC 81 MG tablet  Take 162 mg by mouth daily.     clopidogrel 75 MG tablet  Commonly known as:  PLAVIX  Take 1 tablet (75 mg total) by mouth daily with breakfast.     CRESTOR 20 MG tablet  Generic drug:  rosuvastatin  Take 20 mg by mouth every evening.     dutasteride 0.5 MG capsule  Commonly known as:  AVODART  Take 0.5 mg by mouth every other day.     metoprolol succinate 50 MG 24 hr tablet  Commonly known as:  TOPROL-XL  Take 50 mg by mouth daily.         Duration of Discharge Encounter: Greater than 30 minutes including physician time.  Angelena Form PA-C 11/03/2013 7:42 AM

## 2013-11-03 NOTE — Progress Notes (Signed)
0900 Cardiac Rehab Pt has been up ambulating in hall independently, denies any problems. He does not have any questions related to education provided yesterday. Beatrix FettersHughes, Makenley Shimp G, RN 11/03/2013 9:20 AM

## 2013-11-16 ENCOUNTER — Encounter: Payer: Self-pay | Admitting: *Deleted

## 2013-11-16 ENCOUNTER — Encounter: Payer: Self-pay | Admitting: Cardiology

## 2013-11-16 ENCOUNTER — Encounter (HOSPITAL_COMMUNITY)
Admission: RE | Admit: 2013-11-16 | Discharge: 2013-11-16 | Disposition: A | Discharge status: Home / Self Care | Payer: PRIVATE HEALTH INSURANCE | Source: Ambulatory Visit | Attending: Cardiology | Admitting: Cardiology

## 2013-11-16 ENCOUNTER — Ambulatory Visit (INDEPENDENT_AMBULATORY_CARE_PROVIDER_SITE_OTHER): Payer: PRIVATE HEALTH INSURANCE | Admitting: Cardiology

## 2013-11-16 VITALS — BP 122/82 | HR 60 | Ht 72.0 in | Wt 190.0 lb

## 2013-11-16 DIAGNOSIS — I4510 Unspecified right bundle-branch block: Secondary | ICD-10-CM | POA: Insufficient documentation

## 2013-11-16 DIAGNOSIS — E785 Hyperlipidemia, unspecified: Secondary | ICD-10-CM | POA: Insufficient documentation

## 2013-11-16 DIAGNOSIS — I472 Ventricular tachycardia, unspecified: Secondary | ICD-10-CM | POA: Insufficient documentation

## 2013-11-16 DIAGNOSIS — I200 Unstable angina: Secondary | ICD-10-CM | POA: Insufficient documentation

## 2013-11-16 DIAGNOSIS — I2510 Atherosclerotic heart disease of native coronary artery without angina pectoris: Secondary | ICD-10-CM

## 2013-11-16 DIAGNOSIS — I10 Essential (primary) hypertension: Secondary | ICD-10-CM | POA: Insufficient documentation

## 2013-11-16 DIAGNOSIS — Z9861 Coronary angioplasty status: Secondary | ICD-10-CM | POA: Insufficient documentation

## 2013-11-16 DIAGNOSIS — Z87891 Personal history of nicotine dependence: Secondary | ICD-10-CM | POA: Insufficient documentation

## 2013-11-16 DIAGNOSIS — I4729 Other ventricular tachycardia: Secondary | ICD-10-CM | POA: Insufficient documentation

## 2013-11-16 DIAGNOSIS — Z5189 Encounter for other specified aftercare: Secondary | ICD-10-CM | POA: Insufficient documentation

## 2013-11-16 LAB — HEPATIC FUNCTION PANEL
ALK PHOS: 52 U/L (ref 39–117)
ALT: 24 U/L (ref 0–53)
AST: 24 U/L (ref 0–37)
Albumin: 4.2 g/dL (ref 3.5–5.2)
BILIRUBIN TOTAL: 1.2 mg/dL (ref 0.3–1.2)
Bilirubin, Direct: 0.1 mg/dL (ref 0.0–0.3)
Total Protein: 7.1 g/dL (ref 6.0–8.3)

## 2013-11-16 LAB — LIPID PANEL
CHOL/HDL RATIO: 3
Cholesterol: 109 mg/dL (ref 0–200)
HDL: 42.5 mg/dL (ref >39.00)
LDL Cholesterol: 49 mg/dL (ref 0–99)
Triglycerides: 89 mg/dL (ref 0.0–149.0)
VLDL: 17.8 mg/dL (ref 0.0–40.0)

## 2013-11-16 MED ORDER — ROSUVASTATIN CALCIUM 20 MG PO TABS: 20.0000 mg | ORAL_TABLET | Freq: Every evening | ORAL | Status: DC

## 2013-11-16 MED ORDER — METOPROLOL SUCCINATE ER 50 MG PO TB24: 50.0000 mg | ORAL_TABLET | Freq: Every day | ORAL | Status: DC

## 2013-11-16 MED ORDER — AMLODIPINE BESYLATE 5 MG PO TABS: 5.0000 mg | ORAL_TABLET | Freq: Every day | ORAL | Status: DC

## 2013-11-16 NOTE — Progress Notes (Signed)
HPI: FU CAD. Echocardiogram in November of 2014 showed normal LV function. Carotid Dopplers in December of 2014 showed less than 39% bilateral stenosis. ABIs in December of 2014 showed no significant stenosis. Exercise treadmill in Dec 2014 showed no ST changes. Patient continued to have exertional chest pain. He was therefore scheduled for a cardiac catheterization which was performed in January of 2015. This revealed normal left main. 40% mid LAD. 80-90% mid circumflex. The circumflex was occluded distally. The distal vessel filled from collaterals. There was a 90% ostial second obtuse marginal. There was a 90% mid right coronary artery. Ejection fraction normal. Patient had PCI of the right coronary artery and circumflex. Since discharge, he has some chest tightness with walking long distances much improved. No rest symptoms. No dyspnea, orthopnea or PND. No pedal edema.   Current Outpatient Prescriptions  Medication Sig Dispense Refill  . acetaminophen (TYLENOL) 325 MG tablet Take 2 tablets (650 mg total) by mouth every 4 (four) hours as needed for headache or mild pain.      Marland Kitchen. aspirin EC 81 MG tablet Take 81 mg by mouth daily.       . calcium carbonate 200 MG capsule Take 250 mg by mouth daily.      . clopidogrel (PLAVIX) 75 MG tablet Take 1 tablet (75 mg total) by mouth daily with breakfast.  90 tablet  3  . CRESTOR 20 MG tablet Take 20 mg by mouth every evening.       . dutasteride (AVODART) 0.5 MG capsule Take 0.5 mg by mouth every other day.      Marland Kitchen. glucosamine-chondroitin 500-400 MG tablet Take 1 tablet by mouth daily.      . metoprolol succinate (TOPROL-XL) 50 MG 24 hr tablet Take 50 mg by mouth daily.       . Multiple Vitamin (MULTIVITAMIN) tablet Take 1 tablet by mouth daily.      . nitroGLYCERIN (NITROSTAT) 0.4 MG SL tablet Place 1 tablet (0.4 mg total) under the tongue every 5 (five) minutes as needed for chest pain.  25 tablet  2   No current facility-administered medications  for this visit.     Past Medical History  Diagnosis Date  . HLD (hyperlipidemia)     "started RX mid December 2014"  . Back pain   . HTN (hypertension)     "started RX mid December 2014"  . Anginal pain   . Coronary artery disease 11/02/13    RCA/ CFX DES  . Chronic bronchitis     "get it q yr" (11/01/2013)  . Exertional shortness of breath     "related to this heart issue" (11/01/2013)  . Arthritis     "maybe back and shoulders" (11/01/2013)  . Gout attack     "once" (11/01/2013)  . RBBB   . BPH (benign prostatic hyperplasia)   . PVD (peripheral vascular disease) 12/14    mild ICA disease    Past Surgical History  Procedure Laterality Date  . Cervical disc surgery  ~ 2005  . Knee surgery Right 1976    "open; torn cartilage"  . Tonsillectomy    . Coronary angioplasty with stent placement  11/01/2013    RCA and CFX  . Inguinal hernia repair Left 2000's    History   Social History  . Marital Status: Married    Spouse Name: N/A    Number of Children: 3  . Years of Education: N/A   Occupational History  .  Construction   Social History Main Topics  . Smoking status: Former Smoker -- 0.50 packs/day for 5 years    Types: Cigarettes  . Smokeless tobacco: Never Used     Comment: 11/01/2013 "stopped smoking in the 1980's"  . Alcohol Use: 8.4 oz/week    14 Cans of beer per week     Comment: 11/01/2013 "2 beers per night"  . Drug Use: Not on file  . Sexual Activity: Yes   Other Topics Concern  . Not on file   Social History Narrative  . No narrative on file    ROS: no fevers or chills, productive cough, hemoptysis, dysphasia, odynophagia, melena, hematochezia, dysuria, hematuria, rash, seizure activity, orthopnea, PND, pedal edema, claudication. Remaining systems are negative.  Physical Exam: Well-developed well-nourished in no acute distress.  Skin is warm and dry.  HEENT is normal.  Neck is supple.  Chest is clear to auscultation with normal expansion.    Cardiovascular exam is regular rate and rhythm.  Abdominal exam nontender or distended. No masses palpated. Extremities show no edema. neuro grossly intact  ECG sinus rhythm, incomplete right bundle branch block, no ST changes.

## 2013-11-16 NOTE — Progress Notes (Signed)
Cardiac Rehab Medication Review by a Pharmacist  Does the patient  feel that his/her medications are working for him/her?  yes  Has the patient been experiencing any side effects to the medications prescribed?  no  Does the patient measure his/her own blood pressure or blood glucose at home?  no   Does the patient have any problems obtaining medications due to transportation or finances?   no  Understanding of regimen: good Understanding of indications: good Potential of compliance: good  Pharmacist comments: Patient has a good understanding of his medications. He takes some OTC medications at home, but he could not remember all of their names and strengths. I asked him to bring them to his next appointment. His dose of aspirin was changed to one tablet daily.   Christiane HaJonathan A. Lenon AhmadiBinz, PharmD Clinical Pharmacist - Resident Pager: [phone] Pharmacy: [phone] 11/16/2013 8:50 AM

## 2013-11-16 NOTE — Assessment & Plan Note (Signed)
Continue statin. Check lipids and liver. 

## 2013-11-16 NOTE — Assessment & Plan Note (Signed)
Continue present medications. Blood pressure controlled. 

## 2013-11-16 NOTE — Assessment & Plan Note (Signed)
Continue aspirin, Plavix and statin. He has some tightness with walking long distances but this is much improved. He did have an occluded distal circumflex with collaterals. I will add Norvasc 5 mg daily to see if this improves his symptoms.

## 2013-11-16 NOTE — Patient Instructions (Signed)
Your physician recommends that you schedule a follow-up appointment in: 3 MONTHS WITH DR CRENSHAW  START AMLODIPINE 5 MG ONCE DAILY  Your physician recommends that you HAVE LAB WORK TODAY

## 2013-11-20 ENCOUNTER — Encounter (HOSPITAL_COMMUNITY)
Admission: RE | Admit: 2013-11-20 | Discharge: 2013-11-20 | Disposition: A | Discharge status: Home / Self Care | Payer: PRIVATE HEALTH INSURANCE | Source: Ambulatory Visit | Attending: Cardiology | Admitting: Cardiology

## 2013-11-20 NOTE — Progress Notes (Addendum)
Pt in today for his first day of exercise in Cardiac rehab phase II program.  Pt tolerated exercise with no complaints.  Monitor showed Sr with no ectopy.  Reviewed medications. Pt did note that he had a MD appt on Thursday.   amlodipine 5mg  added for pt's complaint of angina on exertion.  Pt reported improvement of angina like symptoms with the addition of the medication and he is pleased with the improvement. PHQ2 score 0.  Pt feels more positive about his recovery now that the medication for angina has been added.  Pt denies any depression and feels he has a good support system in place. Short term goal  Verbalized by pt decrease in pain on exertion which pt feels a significant improvement in this area after his appt on Thursday.  Long term goal to lose weight, improve health and decrease limitations will be ongoing and reassessed as needed. Alanson Alyarlette Carlton RN, BSN

## 2013-11-22 ENCOUNTER — Encounter (HOSPITAL_COMMUNITY)
Admission: RE | Admit: 2013-11-22 | Discharge: 2013-11-22 | Disposition: A | Discharge status: Home / Self Care | Payer: PRIVATE HEALTH INSURANCE | Source: Ambulatory Visit | Attending: Cardiology | Admitting: Cardiology

## 2013-11-22 NOTE — Progress Notes (Signed)
Pt c/o chest discomfort during Bike Test at Cardiac rehab.  Rates 2/10.  Immediately relieved with rest.  Pt states he recently added amlodipine for angina at home. Pt reports this has improved his symptoms.  Pt able to continue light activity without pain. Dr. Jens Somrenshaw made aware.

## 2013-11-23 ENCOUNTER — Ambulatory Visit (HOSPITAL_COMMUNITY): Payer: PRIVATE HEALTH INSURANCE

## 2013-11-24 ENCOUNTER — Encounter (HOSPITAL_COMMUNITY)
Admission: RE | Admit: 2013-11-24 | Discharge: 2013-11-24 | Disposition: A | Discharge status: Home / Self Care | Payer: PRIVATE HEALTH INSURANCE | Source: Ambulatory Visit | Attending: Cardiology | Admitting: Cardiology

## 2013-11-24 NOTE — Progress Notes (Signed)
Pt denied any reoccurrence of chest discomfort while exercising on the bike however pt did report that he had a return of chest discomfort on two different occasions.  Pt had discomfort while exercising on the elliptical at home which resolved with rest.  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  Will send in basket to Dr. Jens Somrenshaw to advise him of pt's complaint.

## 2013-11-25 ENCOUNTER — Telehealth (HOSPITAL_COMMUNITY): Payer: Self-pay | Admitting: *Deleted

## 2013-11-25 NOTE — Telephone Encounter (Signed)
Message copied by Chelsea AusARLTON, CARLETTE B on Sat Nov 25, 2013  7:11 AM ------      Message from: Lewayne BuntingRENSHAW, BRIAN S      Created: Fri Nov 24, 2013  6:24 PM      Regarding: RE: Exertional Chest Discomfort       Pt has had mild exertional angina since pci. Continue medical therapy. Call office if symptoms worsen.      Olga MillersBrian Crenshaw            ----- Message -----         From: Chelsea Ausarlette B Carlton, RN         Sent: 11/24/2013   8:56 AM           To: Deliah Goodyebra Mathis, RN, Lewayne BuntingBrian S Crenshaw, MD      Subject: Exertional Chest Discomfort                              Pt s/p DES to RCA/CFX on 11/01/2013.  Started Rehab this week.  Faxed information on Wednesday regarding chest discomfort pt felt during his bike test which resolved with rest.  Followed up with pt today during exercise.  Pt did not have any chest discomfort but admitted that he was not working as hard as he did on Wednesday.  Questioned pt regarding any reoccurrence, Pt   had two different episodes of discomfort.  One while exercising on his elliptical at home chest discomfort resolved when he stopped  Second this morning prior to exercise.  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  On his last appointment, amlodipine 5 mg was added as a anti-angina agent.             Please advise      Carlette                   ------

## 2013-11-27 ENCOUNTER — Encounter (HOSPITAL_COMMUNITY): Payer: PRIVATE HEALTH INSURANCE

## 2013-11-27 ENCOUNTER — Encounter (HOSPITAL_COMMUNITY)
Admission: RE | Admit: 2013-11-27 | Discharge: 2013-11-27 | Disposition: A | Discharge status: Home / Self Care | Payer: PRIVATE HEALTH INSURANCE | Source: Ambulatory Visit | Attending: Cardiology | Admitting: Cardiology

## 2013-11-29 ENCOUNTER — Encounter (HOSPITAL_COMMUNITY)
Admission: RE | Admit: 2013-11-29 | Discharge: 2013-11-29 | Disposition: A | Discharge status: Home / Self Care | Payer: PRIVATE HEALTH INSURANCE | Source: Ambulatory Visit | Attending: Cardiology | Admitting: Cardiology

## 2013-11-29 ENCOUNTER — Encounter: Payer: Self-pay | Admitting: Cardiology

## 2013-11-29 ENCOUNTER — Encounter (HOSPITAL_COMMUNITY): Payer: PRIVATE HEALTH INSURANCE

## 2013-11-29 NOTE — Progress Notes (Signed)
Pt in today for 6:45 am exercise class.  While on the treadmill pt experienced chest discomfort 2/10.  Pt dropped his incline and the chest discomfort decreased to 0.  Bp 106/56.  Pt did not have a return of symptoms for the remainder of exercise at low level.  Pt is disappointed that he continues to have chest discomfort with any exertion.  In basket message sent to Dr. Jens Somrenshaw as well as fax to office.

## 2013-11-30 ENCOUNTER — Ambulatory Visit: Payer: PRIVATE HEALTH INSURANCE | Admitting: Cardiology

## 2013-11-30 ENCOUNTER — Encounter: Payer: Self-pay | Admitting: Cardiology

## 2013-11-30 ENCOUNTER — Ambulatory Visit (INDEPENDENT_AMBULATORY_CARE_PROVIDER_SITE_OTHER): Payer: PRIVATE HEALTH INSURANCE | Admitting: Cardiology

## 2013-11-30 VITALS — BP 128/82 | HR 57 | Ht 72.0 in | Wt 187.8 lb

## 2013-11-30 DIAGNOSIS — I10 Essential (primary) hypertension: Secondary | ICD-10-CM

## 2013-11-30 DIAGNOSIS — E785 Hyperlipidemia, unspecified: Secondary | ICD-10-CM

## 2013-11-30 DIAGNOSIS — I2510 Atherosclerotic heart disease of native coronary artery without angina pectoris: Secondary | ICD-10-CM

## 2013-11-30 MED ORDER — AMLODIPINE BESYLATE 10 MG PO TABS: 10.0000 mg | ORAL_TABLET | Freq: Every day | ORAL | Status: DC

## 2013-11-30 NOTE — Patient Instructions (Signed)
Your physician recommends that you schedule a follow-up appointment in: 8 WEEKS WITH DR CRENSHAW  INCREASE AMLODIPINE TO 10 MG ONCE DAILY

## 2013-11-30 NOTE — Assessment & Plan Note (Signed)
Continue statin. 

## 2013-11-30 NOTE — Assessment & Plan Note (Signed)
Continue present medications. 

## 2013-11-30 NOTE — Progress Notes (Signed)
HPI: FU CAD. Echocardiogram in November of 2014 showed normal LV function. Carotid Dopplers in December of 2014 showed less than 39% bilateral stenosis. ABIs in December of 2014 showed no significant stenosis. Exercise treadmill in Dec 2014 showed no ST changes. Patient continued to have exertional chest pain. He was therefore scheduled for a cardiac catheterization which was performed in January of 2015. This revealed normal left main. 40% mid LAD. 80-90% mid circumflex. The circumflex was occluded distally. The distal vessel filled from collaterals. There was a 90% ostial second obtuse marginal. There was a 90% mid right coronary artery. Ejection fraction normal. Patient had PCI of the right coronary artery and circumflex. Norvasc added at last office visit due to improved but continued angina. Since then, he has some chest discomfort with vigorous activities. This does not occur with routine activities. It resolves promptly with rest. He feels somewhat better compared to last office visit. No orthopnea, PND or pedal edema.   Current Outpatient Prescriptions  Medication Sig Dispense Refill  . acetaminophen (TYLENOL) 325 MG tablet Take 2 tablets (650 mg total) by mouth every 4 (four) hours as needed for headache or mild pain.      Marland Kitchen amLODipine (NORVASC) 5 MG tablet Take 1 tablet (5 mg total) by mouth daily.  90 tablet  4  . aspirin EC 81 MG tablet Take 81 mg by mouth daily.       . calcium carbonate 200 MG capsule Take 250 mg by mouth daily.      . clopidogrel (PLAVIX) 75 MG tablet Take 1 tablet (75 mg total) by mouth daily with breakfast.  90 tablet  3  . dutasteride (AVODART) 0.5 MG capsule Take 0.5 mg by mouth every other day.      Marland Kitchen glucosamine-chondroitin 500-400 MG tablet Take 1 tablet by mouth daily.      . metoprolol succinate (TOPROL-XL) 50 MG 24 hr tablet Take 1 tablet (50 mg total) by mouth daily.  90 tablet  4  . Multiple Vitamin (MULTIVITAMIN) tablet Take 1 tablet by mouth daily.       . nitroGLYCERIN (NITROSTAT) 0.4 MG SL tablet Place 1 tablet (0.4 mg total) under the tongue every 5 (five) minutes as needed for chest pain.  25 tablet  2  . rosuvastatin (CRESTOR) 20 MG tablet Take 1 tablet (20 mg total) by mouth every evening.  90 tablet  4   No current facility-administered medications for this visit.     Past Medical History  Diagnosis Date  . HLD (hyperlipidemia)     "started RX mid December 2014"  . Back pain   . HTN (hypertension)     "started RX mid December 2014"  . Anginal pain   . Coronary artery disease 11/02/13    RCA/ CFX DES  . Chronic bronchitis     "get it q yr" (11/01/2013)  . Exertional shortness of breath     "related to this heart issue" (11/01/2013)  . Arthritis     "maybe back and shoulders" (11/01/2013)  . Gout attack     "once" (11/01/2013)  . RBBB   . BPH (benign prostatic hyperplasia)   . PVD (peripheral vascular disease) 12/14    mild ICA disease    Past Surgical History  Procedure Laterality Date  . Cervical disc surgery  ~ 2005  . Knee surgery Right 1976    "open; torn cartilage"  . Tonsillectomy    . Coronary angioplasty with stent placement  11/01/2013    RCA and CFX  . Inguinal hernia repair Left 2000's    History   Social History  . Marital Status: Married    Spouse Name: N/A    Number of Children: 3  . Years of Education: N/A   Occupational History  .      Construction   Social History Main Topics  . Smoking status: Former Smoker -- 0.50 packs/day for 5 years    Types: Cigarettes  . Smokeless tobacco: Never Used     Comment: 11/01/2013 "stopped smoking in the 1980's"  . Alcohol Use: 8.4 oz/week    14 Cans of beer per week     Comment: 11/01/2013 "2 beers per night"  . Drug Use: Not on file  . Sexual Activity: Yes   Other Topics Concern  . Not on file   Social History Narrative  . No narrative on file    ROS: no fevers or chills, productive cough, hemoptysis, dysphasia, odynophagia, melena,  hematochezia, dysuria, hematuria, rash, seizure activity, orthopnea, PND, pedal edema, claudication. Remaining systems are negative.  Physical Exam: Well-developed well-nourished in no acute distress.  Skin is warm and dry.  HEENT is normal.  Neck is supple.  Chest is clear to auscultation with normal expansion.  Cardiovascular exam is regular rate and rhythm.  Abdominal exam nontender or distended. No masses palpated. Extremities show no edema. neuro grossly intact  ECG sinus bradycardia at a rate of 57. Incomplete right bundle branch block. No ST changes.

## 2013-11-30 NOTE — Assessment & Plan Note (Signed)
Continue aspirin, Plavix and statin. He has some tightness with walking long distances but this is improved. He did have an occluded distal circumflex with collaterals. I will increase Norvasc to 10 mg daily to see if this improves his symptoms. I will review his previous cath films with Dr. Mcalhany. GivClifton Henderson that his symptoms have markedly improved since his previous intervention I feel his angina is most likely related to his distal circumflex with inadequate collaterals. We will consider repeat catheterization if Dr Juan JamesMcalhany feels there is another target for possible PCI.

## 2013-12-01 ENCOUNTER — Encounter (HOSPITAL_COMMUNITY)
Admission: RE | Admit: 2013-12-01 | Discharge: 2013-12-01 | Disposition: A | Discharge status: Home / Self Care | Payer: PRIVATE HEALTH INSURANCE | Source: Ambulatory Visit | Attending: Cardiology | Admitting: Cardiology

## 2013-12-01 ENCOUNTER — Encounter (HOSPITAL_COMMUNITY): Payer: PRIVATE HEALTH INSURANCE

## 2013-12-04 ENCOUNTER — Encounter (HOSPITAL_COMMUNITY): Payer: PRIVATE HEALTH INSURANCE

## 2013-12-04 ENCOUNTER — Encounter (HOSPITAL_COMMUNITY)
Admission: RE | Admit: 2013-12-04 | Discharge: 2013-12-04 | Disposition: A | Discharge status: Home / Self Care | Payer: PRIVATE HEALTH INSURANCE | Source: Ambulatory Visit | Attending: Cardiology | Admitting: Cardiology

## 2013-12-06 ENCOUNTER — Encounter (HOSPITAL_COMMUNITY)
Admission: RE | Admit: 2013-12-06 | Discharge: 2013-12-06 | Disposition: A | Discharge status: Home / Self Care | Payer: PRIVATE HEALTH INSURANCE | Source: Ambulatory Visit | Attending: Cardiology | Admitting: Cardiology

## 2013-12-06 ENCOUNTER — Encounter (HOSPITAL_COMMUNITY): Payer: PRIVATE HEALTH INSURANCE

## 2013-12-08 ENCOUNTER — Encounter (HOSPITAL_COMMUNITY)
Admission: RE | Admit: 2013-12-08 | Discharge: 2013-12-08 | Disposition: A | Discharge status: Home / Self Care | Payer: PRIVATE HEALTH INSURANCE | Source: Ambulatory Visit | Attending: Cardiology | Admitting: Cardiology

## 2013-12-08 ENCOUNTER — Encounter (HOSPITAL_COMMUNITY): Payer: PRIVATE HEALTH INSURANCE

## 2013-12-08 NOTE — Progress Notes (Signed)
Pt complained of the return of angina type discomfort while exercising on the Treadmill.  Pt workload at the the time of discomfort was 3.0/2.0.  Decreased the workload to 2.6/1.0.  Chest pain remained at a level 1-2/10.  Incline decreased to level 0. Pain resolved, bp 110/60.  Pt able to continue to exercise on the bike at a level 2.0 speed BP 144/70 and Stepper 5.0 126/66 with no symptoms or complaints.  Pt has not been able to increase workloads at home - walking is his primary source of exercise.  Due to the weather and temperatures he has not been able to walk in his neighborhood.  Post exercise bp 106/50.  Pt stated that he was curious about the consultation between Dr. Jens Somrenshaw and Dr. Clifton JamesMcalhany of whether there is anything that can be done in the cath lab to resolve his discomfort. Karlene Linemanarlette Cutler Sunday RN

## 2013-12-11 ENCOUNTER — Encounter (HOSPITAL_COMMUNITY)
Admission: RE | Admit: 2013-12-11 | Discharge: 2013-12-11 | Disposition: A | Discharge status: Home / Self Care | Payer: PRIVATE HEALTH INSURANCE | Source: Ambulatory Visit | Attending: Cardiology | Admitting: Cardiology

## 2013-12-11 ENCOUNTER — Encounter (HOSPITAL_COMMUNITY): Payer: PRIVATE HEALTH INSURANCE

## 2013-12-11 DIAGNOSIS — I10 Essential (primary) hypertension: Secondary | ICD-10-CM | POA: Insufficient documentation

## 2013-12-11 DIAGNOSIS — I472 Ventricular tachycardia, unspecified: Secondary | ICD-10-CM | POA: Insufficient documentation

## 2013-12-11 DIAGNOSIS — Z9861 Coronary angioplasty status: Secondary | ICD-10-CM | POA: Insufficient documentation

## 2013-12-11 DIAGNOSIS — E785 Hyperlipidemia, unspecified: Secondary | ICD-10-CM | POA: Insufficient documentation

## 2013-12-11 DIAGNOSIS — I200 Unstable angina: Secondary | ICD-10-CM | POA: Insufficient documentation

## 2013-12-11 DIAGNOSIS — I2510 Atherosclerotic heart disease of native coronary artery without angina pectoris: Secondary | ICD-10-CM | POA: Insufficient documentation

## 2013-12-11 DIAGNOSIS — I4729 Other ventricular tachycardia: Secondary | ICD-10-CM | POA: Insufficient documentation

## 2013-12-11 DIAGNOSIS — Z5189 Encounter for other specified aftercare: Secondary | ICD-10-CM | POA: Insufficient documentation

## 2013-12-11 DIAGNOSIS — I4510 Unspecified right bundle-branch block: Secondary | ICD-10-CM | POA: Insufficient documentation

## 2013-12-11 DIAGNOSIS — Z87891 Personal history of nicotine dependence: Secondary | ICD-10-CM | POA: Insufficient documentation

## 2013-12-11 NOTE — Progress Notes (Signed)
I have reviewed home exercise with [name]. The patient was advised to walk 2-4 days per week outside of CRP II for 30-45 minutes.  Pt will also complete one additional day of hand weights outside of CRP II.  Progression of exercise prescription was discussed.  Reviewed THR, pulse, RPE, sign and symptoms, NTG use and when to call 911 or MD.  Pt voiced understanding. [id number]  Alexia FreestoneKendall Macarthur Lorusso, MS, ACSM RCEP 12/11/2013 9:07 AM

## 2013-12-13 ENCOUNTER — Encounter (HOSPITAL_COMMUNITY): Payer: PRIVATE HEALTH INSURANCE

## 2013-12-13 ENCOUNTER — Encounter (HOSPITAL_COMMUNITY)
Admission: RE | Admit: 2013-12-13 | Discharge: 2013-12-13 | Disposition: A | Discharge status: Home / Self Care | Payer: PRIVATE HEALTH INSURANCE | Source: Ambulatory Visit | Attending: Cardiology | Admitting: Cardiology

## 2013-12-15 ENCOUNTER — Encounter (HOSPITAL_COMMUNITY): Payer: PRIVATE HEALTH INSURANCE

## 2013-12-15 ENCOUNTER — Encounter (HOSPITAL_COMMUNITY)
Admission: RE | Admit: 2013-12-15 | Discharge: 2013-12-15 | Disposition: A | Discharge status: Home / Self Care | Payer: PRIVATE HEALTH INSURANCE | Source: Ambulatory Visit | Attending: Cardiology | Admitting: Cardiology

## 2013-12-15 NOTE — Progress Notes (Signed)
Juan Henderson 63 y.o. male Nutrition Note Spoke with pt.  Nutrition Plan and Nutrition Survey goals reviewed with pt. Pt is following Step 2 of the Therapeutic Lifestyle Changes diet. According to RN Assessment, the pt reported he was vegetarian/vegan. Per discussion, pt is vegetarian at home and eats chicken, fish, eggs, milk, and cheese primarily when eating out. Pt eats out for lunch daily at "a cafeteria or [employer] meat and 2 vegetables kind of place." Eating out heart healthy discussed. Pt wants to lose wt. Pt wt is down 3.5 kg (7.7 lb) over the past month. Rate of wt loss appears safe. Pt has been trying to lose wt by "decreasing portion sizes eaten." Wt loss tips reviewed. Pt expressed understanding of the information reviewed. Pt aware of nutrition education classes offered and is unable to attend nutrition classes.  Nutrition Diagnosis   Food-and nutrition-related knowledge deficit related to lack of exposure to information as related to diagnosis of: ? CVD   Overweight related to excessive energy intake as evidenced by a BMI of 26.0 Nutrition RX/ Estimated Daily Nutrition Needs for: wt loss  1550-2050 kcal, 40-55 gm fat, 10-14 gm sat fat, 1.5-2.1 gm trans-fat, <1500 mg sodium   Nutrition Intervention   Pt's individual nutrition plan including cholesterol goals reviewed with pt.   Benefits of adopting Therapeutic Lifestyle Changes discussed when Medficts reviewed.   Pt to attend the Portion Distortion class   Pt given handouts for: ? Nutrition I class ? Nutrition II class   Continue client-centered nutrition education by RD, as part of interdisciplinary care.  Goal(s)   Pt to identify food quantities necessary to achieve: ? wt loss to a goal wt of 167-185 lb (76.1-84.3 kg) at graduation from cardiac rehab.   Monitor and Evaluate progress toward nutrition goal with team. Nutrition Risk:  Low   Juan Henderson, M.Ed, RD, LDN, CDE 12/15/2013 7:48 AM

## 2013-12-18 ENCOUNTER — Encounter (HOSPITAL_COMMUNITY): Payer: PRIVATE HEALTH INSURANCE

## 2013-12-18 ENCOUNTER — Encounter (HOSPITAL_COMMUNITY)
Admission: RE | Admit: 2013-12-18 | Discharge: 2013-12-18 | Disposition: A | Discharge status: Home / Self Care | Payer: PRIVATE HEALTH INSURANCE | Source: Ambulatory Visit | Attending: Cardiology | Admitting: Cardiology

## 2013-12-20 ENCOUNTER — Encounter (HOSPITAL_COMMUNITY): Payer: PRIVATE HEALTH INSURANCE

## 2013-12-21 ENCOUNTER — Encounter: Payer: Self-pay | Admitting: Cardiology

## 2013-12-22 ENCOUNTER — Encounter (HOSPITAL_COMMUNITY): Payer: PRIVATE HEALTH INSURANCE

## 2013-12-25 ENCOUNTER — Encounter (HOSPITAL_COMMUNITY)
Admission: RE | Admit: 2013-12-25 | Discharge: 2013-12-25 | Disposition: A | Discharge status: Home / Self Care | Payer: PRIVATE HEALTH INSURANCE | Source: Ambulatory Visit | Attending: Cardiology | Admitting: Cardiology

## 2013-12-25 ENCOUNTER — Encounter (HOSPITAL_COMMUNITY): Payer: PRIVATE HEALTH INSURANCE

## 2013-12-26 ENCOUNTER — Encounter: Payer: Self-pay | Admitting: Cardiology

## 2013-12-27 ENCOUNTER — Encounter (HOSPITAL_COMMUNITY)
Admission: RE | Admit: 2013-12-27 | Discharge: 2013-12-27 | Disposition: A | Discharge status: Home / Self Care | Payer: PRIVATE HEALTH INSURANCE | Source: Ambulatory Visit | Attending: Cardiology | Admitting: Cardiology

## 2013-12-27 ENCOUNTER — Encounter (HOSPITAL_COMMUNITY): Payer: PRIVATE HEALTH INSURANCE

## 2013-12-29 ENCOUNTER — Encounter (HOSPITAL_COMMUNITY)
Admission: RE | Admit: 2013-12-29 | Discharge: 2013-12-29 | Disposition: A | Discharge status: Home / Self Care | Payer: PRIVATE HEALTH INSURANCE | Source: Ambulatory Visit | Attending: Cardiology | Admitting: Cardiology

## 2013-12-29 ENCOUNTER — Encounter (HOSPITAL_COMMUNITY): Payer: PRIVATE HEALTH INSURANCE

## 2014-01-01 ENCOUNTER — Encounter (HOSPITAL_COMMUNITY)
Admission: RE | Admit: 2014-01-01 | Discharge: 2014-01-01 | Disposition: A | Discharge status: Home / Self Care | Payer: PRIVATE HEALTH INSURANCE | Source: Ambulatory Visit | Attending: Cardiology | Admitting: Cardiology

## 2014-01-01 ENCOUNTER — Encounter: Payer: Self-pay | Admitting: Cardiology

## 2014-01-01 ENCOUNTER — Encounter (HOSPITAL_COMMUNITY): Payer: PRIVATE HEALTH INSURANCE

## 2014-01-03 ENCOUNTER — Encounter (HOSPITAL_COMMUNITY)
Admission: RE | Admit: 2014-01-03 | Discharge: 2014-01-03 | Disposition: A | Discharge status: Home / Self Care | Payer: PRIVATE HEALTH INSURANCE | Source: Ambulatory Visit | Attending: Cardiology | Admitting: Cardiology

## 2014-01-03 ENCOUNTER — Encounter (HOSPITAL_COMMUNITY): Payer: PRIVATE HEALTH INSURANCE

## 2014-01-05 ENCOUNTER — Encounter (HOSPITAL_COMMUNITY): Payer: PRIVATE HEALTH INSURANCE

## 2014-01-05 ENCOUNTER — Encounter (HOSPITAL_COMMUNITY)
Admission: RE | Admit: 2014-01-05 | Discharge: 2014-01-05 | Disposition: A | Discharge status: Home / Self Care | Payer: PRIVATE HEALTH INSURANCE | Source: Ambulatory Visit | Attending: Cardiology | Admitting: Cardiology

## 2014-01-08 ENCOUNTER — Encounter (HOSPITAL_COMMUNITY): Payer: PRIVATE HEALTH INSURANCE

## 2014-01-08 ENCOUNTER — Encounter (HOSPITAL_COMMUNITY)
Admission: RE | Admit: 2014-01-08 | Discharge: 2014-01-08 | Disposition: A | Discharge status: Home / Self Care | Payer: PRIVATE HEALTH INSURANCE | Source: Ambulatory Visit | Attending: Cardiology | Admitting: Cardiology

## 2014-01-10 ENCOUNTER — Encounter (HOSPITAL_COMMUNITY)
Admission: RE | Admit: 2014-01-10 | Discharge: 2014-01-10 | Disposition: A | Discharge status: Home / Self Care | Payer: PRIVATE HEALTH INSURANCE | Source: Ambulatory Visit | Attending: Cardiology | Admitting: Cardiology

## 2014-01-10 ENCOUNTER — Encounter (HOSPITAL_COMMUNITY): Payer: PRIVATE HEALTH INSURANCE

## 2014-01-10 DIAGNOSIS — Z5189 Encounter for other specified aftercare: Secondary | ICD-10-CM | POA: Insufficient documentation

## 2014-01-10 DIAGNOSIS — I472 Ventricular tachycardia, unspecified: Secondary | ICD-10-CM | POA: Insufficient documentation

## 2014-01-10 DIAGNOSIS — Z87891 Personal history of nicotine dependence: Secondary | ICD-10-CM | POA: Insufficient documentation

## 2014-01-10 DIAGNOSIS — E785 Hyperlipidemia, unspecified: Secondary | ICD-10-CM | POA: Insufficient documentation

## 2014-01-10 DIAGNOSIS — I4729 Other ventricular tachycardia: Secondary | ICD-10-CM | POA: Insufficient documentation

## 2014-01-10 DIAGNOSIS — I10 Essential (primary) hypertension: Secondary | ICD-10-CM | POA: Insufficient documentation

## 2014-01-10 DIAGNOSIS — I4510 Unspecified right bundle-branch block: Secondary | ICD-10-CM | POA: Insufficient documentation

## 2014-01-10 DIAGNOSIS — I2510 Atherosclerotic heart disease of native coronary artery without angina pectoris: Secondary | ICD-10-CM | POA: Insufficient documentation

## 2014-01-10 DIAGNOSIS — I200 Unstable angina: Secondary | ICD-10-CM | POA: Insufficient documentation

## 2014-01-10 DIAGNOSIS — Z9861 Coronary angioplasty status: Secondary | ICD-10-CM | POA: Insufficient documentation

## 2014-01-12 ENCOUNTER — Encounter (HOSPITAL_COMMUNITY)
Admission: RE | Admit: 2014-01-12 | Discharge: 2014-01-12 | Disposition: A | Discharge status: Home / Self Care | Payer: PRIVATE HEALTH INSURANCE | Source: Ambulatory Visit | Attending: Cardiology | Admitting: Cardiology

## 2014-01-12 ENCOUNTER — Encounter (HOSPITAL_COMMUNITY): Payer: PRIVATE HEALTH INSURANCE

## 2014-01-15 ENCOUNTER — Encounter (HOSPITAL_COMMUNITY): Payer: PRIVATE HEALTH INSURANCE

## 2014-01-15 ENCOUNTER — Encounter (HOSPITAL_COMMUNITY)
Admission: RE | Admit: 2014-01-15 | Discharge: 2014-01-15 | Disposition: A | Discharge status: Home / Self Care | Payer: PRIVATE HEALTH INSURANCE | Source: Ambulatory Visit | Attending: Cardiology | Admitting: Cardiology

## 2014-01-17 ENCOUNTER — Encounter (HOSPITAL_COMMUNITY)
Admission: RE | Admit: 2014-01-17 | Discharge: 2014-01-17 | Disposition: A | Discharge status: Home / Self Care | Payer: PRIVATE HEALTH INSURANCE | Source: Ambulatory Visit | Attending: Cardiology | Admitting: Cardiology

## 2014-01-17 ENCOUNTER — Encounter (HOSPITAL_COMMUNITY): Payer: PRIVATE HEALTH INSURANCE

## 2014-01-19 ENCOUNTER — Encounter (HOSPITAL_COMMUNITY)
Admission: RE | Admit: 2014-01-19 | Discharge: 2014-01-19 | Disposition: A | Discharge status: Home / Self Care | Payer: PRIVATE HEALTH INSURANCE | Source: Ambulatory Visit | Attending: Cardiology | Admitting: Cardiology

## 2014-01-19 ENCOUNTER — Encounter (HOSPITAL_COMMUNITY): Payer: PRIVATE HEALTH INSURANCE

## 2014-01-22 ENCOUNTER — Encounter (HOSPITAL_COMMUNITY)
Admission: RE | Admit: 2014-01-22 | Discharge: 2014-01-22 | Disposition: A | Discharge status: Home / Self Care | Payer: PRIVATE HEALTH INSURANCE | Source: Ambulatory Visit | Attending: Cardiology | Admitting: Cardiology

## 2014-01-22 ENCOUNTER — Encounter (HOSPITAL_COMMUNITY): Payer: PRIVATE HEALTH INSURANCE

## 2014-01-24 ENCOUNTER — Encounter (HOSPITAL_COMMUNITY): Payer: PRIVATE HEALTH INSURANCE

## 2014-01-24 ENCOUNTER — Encounter (HOSPITAL_COMMUNITY)
Admission: RE | Admit: 2014-01-24 | Discharge: 2014-01-24 | Disposition: A | Discharge status: Home / Self Care | Payer: PRIVATE HEALTH INSURANCE | Source: Ambulatory Visit | Attending: Cardiology | Admitting: Cardiology

## 2014-01-26 ENCOUNTER — Encounter (HOSPITAL_COMMUNITY)
Admission: RE | Admit: 2014-01-26 | Discharge: 2014-01-26 | Disposition: A | Discharge status: Home / Self Care | Payer: PRIVATE HEALTH INSURANCE | Source: Ambulatory Visit | Attending: Cardiology | Admitting: Cardiology

## 2014-01-26 ENCOUNTER — Encounter (HOSPITAL_COMMUNITY): Payer: PRIVATE HEALTH INSURANCE

## 2014-01-29 ENCOUNTER — Encounter: Payer: Self-pay | Admitting: Cardiology

## 2014-01-29 ENCOUNTER — Encounter (HOSPITAL_COMMUNITY)
Admission: RE | Admit: 2014-01-29 | Discharge: 2014-01-29 | Disposition: A | Discharge status: Home / Self Care | Payer: PRIVATE HEALTH INSURANCE | Source: Ambulatory Visit | Attending: Cardiology | Admitting: Cardiology

## 2014-01-29 ENCOUNTER — Ambulatory Visit (INDEPENDENT_AMBULATORY_CARE_PROVIDER_SITE_OTHER): Payer: PRIVATE HEALTH INSURANCE | Admitting: Cardiology

## 2014-01-29 ENCOUNTER — Encounter (HOSPITAL_COMMUNITY): Payer: PRIVATE HEALTH INSURANCE

## 2014-01-29 VITALS — BP 110/76 | HR 72 | Ht 72.0 in | Wt 181.8 lb

## 2014-01-29 DIAGNOSIS — I10 Essential (primary) hypertension: Secondary | ICD-10-CM

## 2014-01-29 DIAGNOSIS — I2510 Atherosclerotic heart disease of native coronary artery without angina pectoris: Secondary | ICD-10-CM

## 2014-01-29 DIAGNOSIS — E785 Hyperlipidemia, unspecified: Secondary | ICD-10-CM

## 2014-01-29 MED ORDER — METOPROLOL SUCCINATE ER 25 MG PO TB24: 25.0000 mg | ORAL_TABLET | Freq: Every day | ORAL | Status: DC

## 2014-01-29 NOTE — Assessment & Plan Note (Signed)
Continue present blood pressure medications. 

## 2014-01-29 NOTE — Assessment & Plan Note (Signed)
Continue statin. 

## 2014-01-29 NOTE — Patient Instructions (Signed)
Your physician wants you to follow-up in: 6 MONTHS WITH DR Jens SomRENSHAW You will receive a reminder letter in the mail two months in advance. If you don't receive a letter, please call our office to schedule the follow-up appointment.   DECREASE METOPROLOL TO 25 MG = 1/2 OF 50 MG TABLET= TAKE AT BEDTIME

## 2014-01-29 NOTE — Assessment & Plan Note (Signed)
Plan continue aspirin, Plavix and statin. His angina is relatively well controlled. I previously reviewed his films with Dr Clifton JamesMcalhany. We feel medical therapy as best option. He is describing some fatigue and depression. I wonder if this may be related to his beta blocker. Change Toprol to 25 mg by mouth each bedtime. If symptoms persist we'll consider discontinuing altogether to see if symptoms resolve. Continue amlodipine.

## 2014-01-29 NOTE — Progress Notes (Signed)
HPI: FU CAD. Echocardiogram in November of 2014 showed normal LV function. Carotid Dopplers in December of 2014 showed less than 39% bilateral stenosis. ABIs in December of 2014 showed no significant stenosis. Exercise treadmill in Dec 2014 showed no ST changes. Patient continued to have exertional chest pain. He was therefore scheduled for a cardiac catheterization which was performed in January of 2015. This revealed normal left main. 40% mid LAD. 80-90% mid circumflex. The circumflex was occluded distally. The distal vessel filled from collaterals. There was a 90% ostial second obtuse marginal. There was a 90% mid right coronary artery. Ejection fraction normal. Patient had PCI of the right coronary artery and circumflex. Patient continued to have some exertional chest pain. I reviewed the films with Dr Clifton JamesMcalhany and medical therapy felt indicated. Angina felt secondary to distal circumflex with inadequate collaterals. Since I last saw him he has chest pain with vigorous activities but not routine activities. No dyspnea. He does note depression and fatigue.   Current Outpatient Prescriptions  Medication Sig Dispense Refill  . acetaminophen (TYLENOL) 325 MG tablet Take 2 tablets (650 mg total) by mouth every 4 (four) hours as needed for headache or mild pain.      Marland Kitchen. amLODipine (NORVASC) 10 MG tablet Take 1 tablet (10 mg total) by mouth daily.  90 tablet  4  . aspirin EC 81 MG tablet Take 81 mg by mouth daily.       . calcium carbonate 200 MG capsule Take 250 mg by mouth daily.      . clopidogrel (PLAVIX) 75 MG tablet Take 1 tablet (75 mg total) by mouth daily with breakfast.  90 tablet  3  . dutasteride (AVODART) 0.5 MG capsule Take 0.5 mg by mouth every other day.      Marland Kitchen. glucosamine-chondroitin 500-400 MG tablet Take 1 tablet by mouth daily.      . metoprolol succinate (TOPROL-XL) 50 MG 24 hr tablet Take 1 tablet (50 mg total) by mouth daily.  90 tablet  4  . Multiple Vitamin (MULTIVITAMIN)  tablet Take 1 tablet by mouth daily.      . nitroGLYCERIN (NITROSTAT) 0.4 MG SL tablet Place 1 tablet (0.4 mg total) under the tongue every 5 (five) minutes as needed for chest pain.  25 tablet  2  . rosuvastatin (CRESTOR) 20 MG tablet Take 1 tablet (20 mg total) by mouth every evening.  90 tablet  4   No current facility-administered medications for this visit.     Past Medical History  Diagnosis Date  . HLD (hyperlipidemia)     "started RX mid December 2014"  . Back pain   . HTN (hypertension)     "started RX mid December 2014"  . Anginal pain   . Coronary artery disease 11/02/13    RCA/ CFX DES  . Chronic bronchitis     "get it q yr" (11/01/2013)  . Exertional shortness of breath     "related to this heart issue" (11/01/2013)  . Arthritis     "maybe back and shoulders" (11/01/2013)  . Gout attack     "once" (11/01/2013)  . RBBB   . BPH (benign prostatic hyperplasia)   . PVD (peripheral vascular disease) 12/14    mild ICA disease    Past Surgical History  Procedure Laterality Date  . Cervical disc surgery  ~ 2005  . Knee surgery Right 1976    "open; torn cartilage"  . Tonsillectomy    . Coronary  angioplasty with stent placement  11/01/2013    RCA and CFX  . Inguinal hernia repair Left 2000's    History   Social History  . Marital Status: Married    Spouse Name: N/A    Number of Children: 3  . Years of Education: N/A   Occupational History  .      Construction   Social History Main Topics  . Smoking status: Former Smoker -- 0.50 packs/day for 5 years    Types: Cigarettes  . Smokeless tobacco: Never Used     Comment: 11/01/2013 "stopped smoking in the 1980's"  . Alcohol Use: 8.4 oz/week    14 Cans of beer per week     Comment: 11/01/2013 "2 beers per night"  . Drug Use: Not on file  . Sexual Activity: Yes   Other Topics Concern  . Not on file   Social History Narrative  . No narrative on file    ROS: no fevers or chills, productive cough, hemoptysis,  dysphasia, odynophagia, melena, hematochezia, dysuria, hematuria, rash, seizure activity, orthopnea, PND, pedal edema, claudication. Remaining systems are negative.  Physical Exam: Well-developed well-nourished in no acute distress.  Skin is warm and dry.  HEENT is normal.  Neck is supple.  Chest is clear to auscultation with normal expansion.  Cardiovascular exam is regular rate and rhythm.  Abdominal exam nontender or distended. No masses palpated. Extremities show no edema. neuro grossly intact

## 2014-01-31 ENCOUNTER — Encounter (HOSPITAL_COMMUNITY)
Admission: RE | Admit: 2014-01-31 | Discharge: 2014-01-31 | Disposition: A | Discharge status: Home / Self Care | Payer: PRIVATE HEALTH INSURANCE | Source: Ambulatory Visit | Attending: Cardiology | Admitting: Cardiology

## 2014-01-31 ENCOUNTER — Encounter (HOSPITAL_COMMUNITY): Payer: PRIVATE HEALTH INSURANCE

## 2014-02-02 ENCOUNTER — Encounter (HOSPITAL_COMMUNITY): Payer: PRIVATE HEALTH INSURANCE

## 2014-02-02 ENCOUNTER — Encounter (HOSPITAL_COMMUNITY)
Admission: RE | Admit: 2014-02-02 | Discharge: 2014-02-02 | Disposition: A | Discharge status: Home / Self Care | Payer: PRIVATE HEALTH INSURANCE | Source: Ambulatory Visit | Attending: Cardiology | Admitting: Cardiology

## 2014-02-05 ENCOUNTER — Encounter (HOSPITAL_COMMUNITY)
Admission: RE | Admit: 2014-02-05 | Discharge: 2014-02-05 | Disposition: A | Discharge status: Home / Self Care | Payer: PRIVATE HEALTH INSURANCE | Source: Ambulatory Visit | Attending: Cardiology | Admitting: Cardiology

## 2014-02-05 ENCOUNTER — Encounter (HOSPITAL_COMMUNITY): Payer: PRIVATE HEALTH INSURANCE

## 2014-02-07 ENCOUNTER — Encounter (HOSPITAL_COMMUNITY): Payer: PRIVATE HEALTH INSURANCE

## 2014-02-07 ENCOUNTER — Encounter (HOSPITAL_COMMUNITY)
Admission: RE | Admit: 2014-02-07 | Discharge: 2014-02-07 | Disposition: A | Discharge status: Home / Self Care | Payer: PRIVATE HEALTH INSURANCE | Source: Ambulatory Visit | Attending: Cardiology | Admitting: Cardiology

## 2014-02-09 ENCOUNTER — Encounter (HOSPITAL_COMMUNITY): Payer: PRIVATE HEALTH INSURANCE

## 2014-02-09 ENCOUNTER — Encounter (HOSPITAL_COMMUNITY)
Admission: RE | Admit: 2014-02-09 | Discharge: 2014-02-09 | Disposition: A | Discharge status: Home / Self Care | Payer: PRIVATE HEALTH INSURANCE | Source: Ambulatory Visit | Attending: Cardiology | Admitting: Cardiology

## 2014-02-09 DIAGNOSIS — I472 Ventricular tachycardia, unspecified: Secondary | ICD-10-CM | POA: Insufficient documentation

## 2014-02-09 DIAGNOSIS — I4729 Other ventricular tachycardia: Secondary | ICD-10-CM | POA: Insufficient documentation

## 2014-02-09 DIAGNOSIS — I4510 Unspecified right bundle-branch block: Secondary | ICD-10-CM | POA: Insufficient documentation

## 2014-02-09 DIAGNOSIS — I10 Essential (primary) hypertension: Secondary | ICD-10-CM | POA: Insufficient documentation

## 2014-02-09 DIAGNOSIS — Z5189 Encounter for other specified aftercare: Secondary | ICD-10-CM | POA: Insufficient documentation

## 2014-02-09 DIAGNOSIS — Z87891 Personal history of nicotine dependence: Secondary | ICD-10-CM | POA: Insufficient documentation

## 2014-02-09 DIAGNOSIS — I200 Unstable angina: Secondary | ICD-10-CM | POA: Insufficient documentation

## 2014-02-09 DIAGNOSIS — I2510 Atherosclerotic heart disease of native coronary artery without angina pectoris: Secondary | ICD-10-CM | POA: Insufficient documentation

## 2014-02-09 DIAGNOSIS — Z9861 Coronary angioplasty status: Secondary | ICD-10-CM | POA: Insufficient documentation

## 2014-02-09 DIAGNOSIS — E785 Hyperlipidemia, unspecified: Secondary | ICD-10-CM | POA: Insufficient documentation

## 2014-02-12 ENCOUNTER — Encounter (HOSPITAL_COMMUNITY)
Admission: RE | Admit: 2014-02-12 | Discharge: 2014-02-12 | Disposition: A | Discharge status: Home / Self Care | Payer: PRIVATE HEALTH INSURANCE | Source: Ambulatory Visit | Attending: Cardiology | Admitting: Cardiology

## 2014-02-12 ENCOUNTER — Encounter (HOSPITAL_COMMUNITY): Payer: PRIVATE HEALTH INSURANCE

## 2014-02-14 ENCOUNTER — Encounter: Payer: Self-pay | Admitting: Cardiology

## 2014-02-14 ENCOUNTER — Encounter (HOSPITAL_COMMUNITY): Payer: PRIVATE HEALTH INSURANCE

## 2014-02-14 ENCOUNTER — Encounter (HOSPITAL_COMMUNITY)
Admission: RE | Admit: 2014-02-14 | Discharge: 2014-02-14 | Disposition: A | Discharge status: Home / Self Care | Payer: PRIVATE HEALTH INSURANCE | Source: Ambulatory Visit | Attending: Cardiology | Admitting: Cardiology

## 2014-02-14 NOTE — Progress Notes (Signed)
Pt graduates from the cardiac rehab phase II program with the completion of 36 exercise classes.  Pt has made excellent progress as evident by his increased MET levels from 2.9 on 2/13 to 7.4 on 4/24.  Pt maintained excellent attendance to exercise as well as education classes.  Pt feels he has met his goal for decrease exertional pain with breathing and improved health and is pleased with the progress he has made. Although he feels his recovery would be faster he "definitely on his way".  Pt feels the exertional angina is acceptable because it decreases immediately with rest.  Pt does not limit his activities due to his angina.  Pt quality of life has improved and he has adjusted well with slowly adding activities from prior to his cardiac event. Pt's long term goal is to decrease weight.  Pt weight at the beginning of the program was 84.9kg and on today his last session his weight is 81.6kg.  Pt will continue to maintain his home exercise at a local gym. Medication list reconciled.  Repeat PHQ2 score 0. Cherre Huger, BSN

## 2014-02-16 ENCOUNTER — Encounter (HOSPITAL_COMMUNITY): Payer: PRIVATE HEALTH INSURANCE

## 2014-02-19 ENCOUNTER — Encounter (HOSPITAL_COMMUNITY): Payer: PRIVATE HEALTH INSURANCE

## 2014-02-19 ENCOUNTER — Encounter: Payer: Self-pay | Admitting: Cardiology

## 2014-02-21 ENCOUNTER — Encounter (HOSPITAL_COMMUNITY): Payer: PRIVATE HEALTH INSURANCE

## 2014-02-23 ENCOUNTER — Encounter (HOSPITAL_COMMUNITY): Payer: PRIVATE HEALTH INSURANCE

## 2014-02-26 ENCOUNTER — Encounter (HOSPITAL_COMMUNITY): Payer: PRIVATE HEALTH INSURANCE

## 2014-02-28 ENCOUNTER — Encounter (HOSPITAL_COMMUNITY): Payer: PRIVATE HEALTH INSURANCE

## 2014-03-02 ENCOUNTER — Encounter (HOSPITAL_COMMUNITY): Payer: PRIVATE HEALTH INSURANCE

## 2014-07-30 ENCOUNTER — Ambulatory Visit (INDEPENDENT_AMBULATORY_CARE_PROVIDER_SITE_OTHER): Payer: Managed Care, Other (non HMO) | Admitting: Cardiology

## 2014-07-30 ENCOUNTER — Encounter: Payer: Self-pay | Admitting: Cardiology

## 2014-07-30 ENCOUNTER — Encounter: Payer: Self-pay | Admitting: *Deleted

## 2014-07-30 VITALS — BP 110/70 | HR 61 | Ht 72.0 in | Wt 180.4 lb

## 2014-07-30 DIAGNOSIS — R0989 Other specified symptoms and signs involving the circulatory and respiratory systems: Secondary | ICD-10-CM

## 2014-07-30 DIAGNOSIS — E785 Hyperlipidemia, unspecified: Secondary | ICD-10-CM

## 2014-07-30 DIAGNOSIS — I2510 Atherosclerotic heart disease of native coronary artery without angina pectoris: Secondary | ICD-10-CM

## 2014-07-30 DIAGNOSIS — I10 Essential (primary) hypertension: Secondary | ICD-10-CM

## 2014-07-30 NOTE — Assessment & Plan Note (Signed)
Continue statin. 

## 2014-07-30 NOTE — Assessment & Plan Note (Signed)
Schedule abdominal ultrasound to exclude aneurysm. 

## 2014-07-30 NOTE — Patient Instructions (Signed)
Your physician wants you to follow-up in: 6 MONTHS WITH DR CRENSHAW You will receive a reminder letter in the mail two months in advance. If you don't receive a letter, please call our office to schedule the follow-up appointment.   Your physician has requested that you have an abdominal aorta duplex. During this test, an ultrasound is used to evaluate the aorta. Allow 30 minutes for this exam. Do not eat after midnight the day before and avoid carbonated beverages  

## 2014-07-30 NOTE — Assessment & Plan Note (Signed)
Blood pressure controlled. Continue present medications. 

## 2014-07-30 NOTE — Progress Notes (Signed)
HPI: FU CAD. Echocardiogram in November of 2014 showed normal LV function. Carotid Dopplers in December of 2014 showed less than 39% bilateral stenosis. ABIs in December of 2014 showed no significant stenosis. Exercise treadmill in Dec 2014 showed no ST changes. Patient continued to have exertional chest pain. He was therefore scheduled for a cardiac catheterization which was performed in January of 2015. This revealed normal left main. 40% mid LAD. 80-90% mid circumflex. The circumflex was occluded distally. The distal vessel filled from collaterals. There was a 90% ostial second obtuse marginal. There was a 90% mid right coronary artery. Ejection fraction normal. Patient had PCI of the right coronary artery and circumflex. Patient continued to have some exertional chest pain. I reviewed the films with Dr Clifton JamesMcalhany and medical therapy felt indicated. Angina felt secondary to distal circumflex with inadequate collaterals. Since I last saw him he has chest pain with vigorous activities but not routine activities. No dyspnea. He does note occasional depression and fatigue.   Current Outpatient Prescriptions  Medication Sig Dispense Refill  . acetaminophen (TYLENOL) 325 MG tablet Take 2 tablets (650 mg total) by mouth every 4 (four) hours as needed for headache or mild pain.      Marland Kitchen. amLODipine (NORVASC) 10 MG tablet Take 1 tablet (10 mg total) by mouth daily.  90 tablet  4  . aspirin EC 81 MG tablet Take 81 mg by mouth daily.       . calcium carbonate 200 MG capsule Take 250 mg by mouth daily.      . clopidogrel (PLAVIX) 75 MG tablet Take 1 tablet (75 mg total) by mouth daily with breakfast.  90 tablet  3  . dutasteride (AVODART) 0.5 MG capsule Take 0.5 mg by mouth every other day.      Marland Kitchen. glucosamine-chondroitin 500-400 MG tablet Take 1 tablet by mouth daily.      . metoprolol succinate (TOPROL-XL) 25 MG 24 hr tablet Take 25 mg by mouth daily. Takes in the evening due to fatigue experienced during  the day      . Multiple Vitamin (MULTIVITAMIN) tablet Take 1 tablet by mouth daily.      . nitroGLYCERIN (NITROSTAT) 0.4 MG SL tablet Place 1 tablet (0.4 mg total) under the tongue every 5 (five) minutes as needed for chest pain.  25 tablet  2  . rosuvastatin (CRESTOR) 20 MG tablet Take 1 tablet (20 mg total) by mouth every evening.  90 tablet  4   No current facility-administered medications for this visit.     Past Medical History  Diagnosis Date  . HLD (hyperlipidemia)     "started RX mid December 2014"  . Back pain   . HTN (hypertension)     "started RX mid December 2014"  . Anginal pain   . Coronary artery disease 11/02/13    RCA/ CFX DES  . Chronic bronchitis     "get it q yr" (11/01/2013)  . Exertional shortness of breath     "related to this heart issue" (11/01/2013)  . Arthritis     "maybe back and shoulders" (11/01/2013)  . Gout attack     "once" (11/01/2013)  . RBBB   . BPH (benign prostatic hyperplasia)   . PVD (peripheral vascular disease) 12/14    mild ICA disease    Past Surgical History  Procedure Laterality Date  . Cervical disc surgery  ~ 2005  . Knee surgery Right 1976    "open; torn cartilage"  .  Tonsillectomy    . Coronary angioplasty with stent placement  11/01/2013    RCA and CFX  . Inguinal hernia repair Left 2000's    History   Social History  . Marital Status: Married    Spouse Name: N/A    Number of Children: 3  . Years of Education: N/A   Occupational History  .      Construction   Social History Main Topics  . Smoking status: Former Smoker -- 0.50 packs/day for 5 years    Types: Cigarettes  . Smokeless tobacco: Never Used     Comment: 11/01/2013 "stopped smoking in the 1980's"  . Alcohol Use: 8.4 oz/week    14 Cans of beer per week     Comment: 11/01/2013 "2 beers per night"  . Drug Use: Not on file  . Sexual Activity: Yes   Other Topics Concern  . Not on file   Social History Narrative  . No narrative on file    ROS: no  fevers or chills, productive cough, hemoptysis, dysphasia, odynophagia, melena, hematochezia, dysuria, hematuria, rash, seizure activity, orthopnea, PND, pedal edema, claudication. Remaining systems are negative.  Physical Exam: Well-developed well-nourished in no acute distress.  Skin is warm and dry.  HEENT is normal.  Neck is supple.  Chest is clear to auscultation with normal expansion.  Cardiovascular exam is regular rate and rhythm.  Abdominal exam nontender or distended. No masses palpated. + bruit Extremities show no edema. neuro grossly intact  ECG Sinus rhythm, right bundle branch block. No ST changes to

## 2014-07-30 NOTE — Assessment & Plan Note (Signed)
Plan continue aspirin, Plavix and statin. His angina is relatively well controlled. I previously reviewed his films with Dr Clifton JamesMcalhany. We feel medical therapy is best option. Continue Toprol. Continue amlodipine.

## 2014-07-31 ENCOUNTER — Telehealth (HOSPITAL_COMMUNITY): Payer: Self-pay | Admitting: *Deleted

## 2014-08-06 ENCOUNTER — Ambulatory Visit (HOSPITAL_COMMUNITY)
Admission: RE | Admit: 2014-08-06 | Discharge: 2014-08-06 | Disposition: A | Discharge status: Home / Self Care | Payer: 59 | Source: Ambulatory Visit | Attending: Cardiovascular Disease | Admitting: Cardiovascular Disease

## 2014-08-06 DIAGNOSIS — R0989 Other specified symptoms and signs involving the circulatory and respiratory systems: Secondary | ICD-10-CM | POA: Insufficient documentation

## 2014-08-06 NOTE — Progress Notes (Signed)
Abdominal Aorta Duplex Completed. No evidence of AAA noted. Marilynne Halstedita Bray Vickerman, BS, RDMS, RVT

## 2014-09-20 ENCOUNTER — Encounter (HOSPITAL_COMMUNITY): Payer: Self-pay | Admitting: Cardiovascular Disease

## 2014-10-01 ENCOUNTER — Encounter (HOSPITAL_COMMUNITY): Payer: Self-pay | Admitting: *Deleted

## 2014-10-01 ENCOUNTER — Emergency Department (HOSPITAL_COMMUNITY)
Admission: EM | Admit: 2014-10-01 | Discharge: 2014-10-01 | Disposition: A | Discharge status: Home / Self Care | Payer: Worker's Compensation | Attending: Emergency Medicine | Admitting: Emergency Medicine

## 2014-10-01 ENCOUNTER — Emergency Department (HOSPITAL_COMMUNITY): Payer: Worker's Compensation

## 2014-10-01 DIAGNOSIS — N400 Enlarged prostate without lower urinary tract symptoms: Secondary | ICD-10-CM | POA: Insufficient documentation

## 2014-10-01 DIAGNOSIS — S0990XA Unspecified injury of head, initial encounter: Secondary | ICD-10-CM | POA: Insufficient documentation

## 2014-10-01 DIAGNOSIS — W208XXA Other cause of strike by thrown, projected or falling object, initial encounter: Secondary | ICD-10-CM | POA: Insufficient documentation

## 2014-10-01 DIAGNOSIS — Z87891 Personal history of nicotine dependence: Secondary | ICD-10-CM | POA: Diagnosis not present

## 2014-10-01 DIAGNOSIS — I25119 Atherosclerotic heart disease of native coronary artery with unspecified angina pectoris: Secondary | ICD-10-CM | POA: Insufficient documentation

## 2014-10-01 DIAGNOSIS — W19XXXA Unspecified fall, initial encounter: Secondary | ICD-10-CM

## 2014-10-01 DIAGNOSIS — E785 Hyperlipidemia, unspecified: Secondary | ICD-10-CM | POA: Insufficient documentation

## 2014-10-01 DIAGNOSIS — Z7982 Long term (current) use of aspirin: Secondary | ICD-10-CM | POA: Diagnosis not present

## 2014-10-01 DIAGNOSIS — M109 Gout, unspecified: Secondary | ICD-10-CM | POA: Insufficient documentation

## 2014-10-01 DIAGNOSIS — Z7902 Long term (current) use of antithrombotics/antiplatelets: Secondary | ICD-10-CM | POA: Insufficient documentation

## 2014-10-01 DIAGNOSIS — Y990 Civilian activity done for income or pay: Secondary | ICD-10-CM | POA: Diagnosis not present

## 2014-10-01 DIAGNOSIS — Y9389 Activity, other specified: Secondary | ICD-10-CM | POA: Diagnosis not present

## 2014-10-01 DIAGNOSIS — M1990 Unspecified osteoarthritis, unspecified site: Secondary | ICD-10-CM | POA: Diagnosis not present

## 2014-10-01 DIAGNOSIS — I739 Peripheral vascular disease, unspecified: Secondary | ICD-10-CM | POA: Insufficient documentation

## 2014-10-01 DIAGNOSIS — R42 Dizziness and giddiness: Secondary | ICD-10-CM | POA: Insufficient documentation

## 2014-10-01 DIAGNOSIS — Z79899 Other long term (current) drug therapy: Secondary | ICD-10-CM | POA: Insufficient documentation

## 2014-10-01 DIAGNOSIS — Y9289 Other specified places as the place of occurrence of the external cause: Secondary | ICD-10-CM | POA: Insufficient documentation

## 2014-10-01 DIAGNOSIS — I10 Essential (primary) hypertension: Secondary | ICD-10-CM | POA: Insufficient documentation

## 2014-10-01 NOTE — Discharge Instructions (Signed)

## 2014-10-01 NOTE — ED Notes (Signed)
NAD at this time, pt is leaving with his friend.

## 2014-10-01 NOTE — ED Provider Notes (Signed)
CSN: [id number]     Arrival date & time 10/01/14  0745 History   First MD Initiated Contact with Patient 10/01/14 0750     Chief Complaint  Patient presents with  . Fall     (Consider location/radiation/quality/duration/timing/severity/associated sxs/prior Treatment) Patient is a 63 y.o. male presenting with fall. The history is provided by the patient.  Fall This is a new problem. The current episode started yesterday. Associated symptoms include headaches. Pertinent negatives include no chest pain, no abdominal pain and no shortness of breath.   patient states that he was in a box at work crouched down and fell sideways and backwards, striking the back was head. No loss consciousness the time. States he was doing fine yesterday but today he was a little unsteady when he got up. States it was just a little wobbly with standing. Mild headache. No confusion. No numbness weakness. He is on Plavix for his heart. No vision changes no fevers. No chest pain. No abdominal pain. No dysuria. No visual changes. No neck pain.  Past Medical History  Diagnosis Date  . HLD (hyperlipidemia)     "started RX mid December 2014"  . Back pain   . HTN (hypertension)     "started RX mid December 2014"  . Anginal pain   . Coronary artery disease 11/02/13    RCA/ CFX DES  . Chronic bronchitis     "get it q yr" (11/01/2013)  . Exertional shortness of breath     "related to this heart issue" (11/01/2013)  . Arthritis     "maybe back and shoulders" (11/01/2013)  . Gout attack     "once" (11/01/2013)  . RBBB   . BPH (benign prostatic hyperplasia)   . PVD (peripheral vascular disease) 12/14    mild ICA disease   Past Surgical History  Procedure Laterality Date  . Cervical disc surgery  ~ 2005  . Knee surgery Right 1976    "open; torn cartilage"  . Tonsillectomy    . Coronary angioplasty with stent placement  11/01/2013    RCA and CFX  . Inguinal hernia repair Left 2000's  . Left heart catheterization  with coronary angiogram N/A 11/01/2013    Procedure: LEFT HEART CATHETERIZATION WITH CORONARY ANGIOGRAM;  Surgeon: Kathleene Hazelhristopher D McAlhany, MD;  [facility];  Service: Cardiovascular;  Laterality: N/A;   Family History  Problem Relation Age of Onset  . Hypertension Father    History  Substance Use Topics  . Smoking status: Former Smoker -- 0.50 packs/day for 5 years    Types: Cigarettes  . Smokeless tobacco: Never Used     Comment: 11/01/2013 "stopped smoking in the 1980's"  . Alcohol Use: 8.4 oz/week    14 Cans of beer per week     Comment: 11/01/2013 "2 beers per night"    Review of Systems  Constitutional: Negative for activity change and appetite change.  Eyes: Negative for pain.  Respiratory: Negative for chest tightness and shortness of breath.   Cardiovascular: Negative for chest pain and leg swelling.  Gastrointestinal: Negative for nausea, vomiting, abdominal pain and diarrhea.  Genitourinary: Negative for flank pain.  Musculoskeletal: Negative for back pain and neck stiffness.  Skin: Negative for rash.  Neurological: Positive for dizziness and headaches. Negative for weakness and numbness.  Psychiatric/Behavioral: Negative for behavioral problems.      Allergies  Review of patient's allergies indicates no known allergies.  Home Medications   Prior to Admission medications   Medication Sig  Start Date End Date Taking? Authorizing Provider  acetaminophen (TYLENOL) 325 MG tablet Take 2 tablets (650 mg total) by mouth every 4 (four) hours as needed for headache or mild pain. 11/03/13  Yes Luke K Kilroy, PA-C  amLODipine (NORVASC) 10 MG tablet Take 1 tablet (10 mg total) by mouth daily. 11/30/13  Yes Lewayne Bunting, MD  aspirin EC 81 MG tablet Take 81 mg by mouth daily.    Yes Historical Provider, MD  CALCIUM PO Take 1 tablet by mouth daily.   Yes Historical Provider, MD  Cholecalciferol (VITAMIN D3) 5000 UNITS TABS Take 1 tablet by mouth daily.    Yes Historical  Provider, MD  clopidogrel (PLAVIX) 75 MG tablet Take 1 tablet (75 mg total) by mouth daily with breakfast. 11/03/13  Yes Abelino Derrick, PA-C  dutasteride (AVODART) 0.5 MG capsule Take 0.5 mg by mouth every other day.   Yes Historical Provider, MD  Glucosamine Sulfate 750 MG TABS Take 1 tablet by mouth daily.    Yes Historical Provider, MD  metoprolol succinate (TOPROL-XL) 50 MG 24 hr tablet Take 25 mg by mouth every evening. Takes in the evening due to fatigue experienced during the day.   Yes Historical Provider, MD  Multiple Vitamin (MULTIVITAMIN) tablet Take 1 tablet by mouth daily.   Yes Historical Provider, MD  nitroGLYCERIN (NITROSTAT) 0.4 MG SL tablet Place 1 tablet (0.4 mg total) under the tongue every 5 (five) minutes as needed for chest pain. 11/03/13  Yes Abelino Derrick, PA-C  Probiotic Product (PROBIOTIC DAILY PO) Take 1 capsule by mouth daily.    Yes Historical Provider, MD  rosuvastatin (CRESTOR) 20 MG tablet Take 1 tablet (20 mg total) by mouth every evening. 11/16/13  Yes Lewayne Bunting, MD  calcium carbonate 200 MG capsule Take 250 mg by mouth daily.    Historical Provider, MD  glucosamine-chondroitin 500-400 MG tablet Take 1 tablet by mouth daily.    Historical Provider, MD  metoprolol succinate (TOPROL-XL) 25 MG 24 hr tablet Take 25 mg by mouth daily. Takes in the evening due to fatigue experienced during the day 01/29/14   Lewayne Bunting, MD   BP 129/69 mmHg  Pulse 64  Temp(Src) 97.8 F (36.6 C) (Oral)  Resp 16  SpO2 99% Physical Exam  Constitutional: He is oriented to person, place, and time. He appears well-developed and well-nourished.  HENT:  Head: Normocephalic and atraumatic.  Eyes: EOM are normal. Pupils are equal, round, and reactive to light.  Neck: Normal range of motion. Neck supple.  Cardiovascular: Normal rate, regular rhythm and normal heart sounds.   No murmur heard. Pulmonary/Chest: Effort normal and breath sounds normal.  Abdominal: Soft.   Musculoskeletal: Normal range of motion. He exhibits no edema.  Neurological: He is alert and oriented to person, place, and time. No cranial nerve deficit.  No Romberg. Normal gait. Finger-nose intact bilaterally.  Skin: Skin is warm and dry.  Psychiatric: He has a normal mood and affect.  Nursing note and vitals reviewed.   ED Course  Procedures (including critical care time) Labs Review Labs Reviewed - No data to display  Imaging Review Ct Head Wo Contrast  10/01/2014   CLINICAL DATA:  Larey Seat backward from a crouched position yesterday at work striking occiput, now with occipital headache today, feels off balance, small soft tissue hematoma, on Plavix  EXAM: CT HEAD WITHOUT CONTRAST  TECHNIQUE: Contiguous axial images were obtained from the base of the skull through the vertex without  intravenous contrast.  COMPARISON:  None  FINDINGS: Normal ventricular morphology.  No midline shift or mass effect.  Normal appearance of brain parenchyma.  No intracranial hemorrhage, mass lesion, or acute infarction.  Visualized paranasal sinuses and mastoid air cells clear.  Bones unremarkable.  IMPRESSION: Normal exam.   Electronically Signed   By: Ulyses SouthwardMark  Boles M.D.   On: 10/01/2014 08:44     EKG Interpretation None      MDM   Final diagnoses:  Fall  Dizziness    Patient is on Plavix and fell and hit his head yesterday. Has some dizziness today. States he feels a little unsteady. Dull headache. Head CT reassuring. Vitals otherwise reassuring. Normal exam. Normal vitals. Will not evaluate the dizziness more thoroughly at this time.    Juliet RudeNathan R. Rubin PayorPickering, MD 10/01/14 0930

## 2014-10-01 NOTE — ED Notes (Signed)
Patient transported to CT 

## 2014-10-01 NOTE — ED Notes (Signed)
Pt reports falling at work yesterday. Was crouched down to get something and then fell backwards and hit his head, no loc. Now feels off balance, pain to touch to back of head, denies n/v.

## 2014-11-05 ENCOUNTER — Other Ambulatory Visit: Payer: Self-pay

## 2014-11-05 MED ORDER — CLOPIDOGREL BISULFATE 75 MG PO TABS: 75.0000 mg | ORAL_TABLET | Freq: Every day | ORAL | Status: DC

## 2014-11-05 NOTE — Telephone Encounter (Signed)
Rx sent to pharmacy   

## 2014-11-19 ENCOUNTER — Telehealth: Payer: Self-pay | Admitting: Cardiology

## 2014-11-19 NOTE — Telephone Encounter (Signed)
Spoke with pt, Aware of dr Ludwig Clarkscrenshaw's recommendations.  Telephone note faxed to the number provided.

## 2014-11-19 NOTE — Telephone Encounter (Signed)
Ok to DC plavix; continue aspirin and do not interrupt for surgery. OK for surgery without further cardiac eval Olga MillersBrian Evelina Lore

## 2014-11-19 NOTE — Telephone Encounter (Signed)
Will forward for dr crenshaw review  

## 2014-11-19 NOTE — Telephone Encounter (Signed)
New message     Request for surgical clearance:  What type of surgery is being performed? Hernia repair 1. When is this surgery scheduled? Not scheduled  2. Are there any medications that need to be held prior to surgery and how long? plavix and aspirin  3. Name of physician performing surgery? Dr Lovie MacadamiaKenney at [facility]  4. What is your office phone and fax number? [phone]---fax [phone] attn Rosalie DoctorLisa Johnson

## 2014-12-03 ENCOUNTER — Other Ambulatory Visit: Payer: Self-pay | Admitting: Cardiology

## 2015-01-14 ENCOUNTER — Other Ambulatory Visit: Payer: Self-pay | Admitting: Cardiology

## 2015-01-14 ENCOUNTER — Telehealth: Payer: Self-pay

## 2015-01-14 DIAGNOSIS — I10 Essential (primary) hypertension: Secondary | ICD-10-CM

## 2015-01-14 DIAGNOSIS — E785 Hyperlipidemia, unspecified: Secondary | ICD-10-CM

## 2015-01-14 NOTE — Telephone Encounter (Signed)
refill 

## 2015-01-15 NOTE — Addendum Note (Signed)
Addended by: Freddi StarrMATHIS, Keniah Klemmer W on: 01/15/2015 10:21 AM   Modules accepted: Orders

## 2015-01-15 NOTE — Telephone Encounter (Signed)
Lab orders placed and mailed to the patient

## 2015-02-07 NOTE — Progress Notes (Signed)
HPI: FU CAD. Echocardiogram in November of 2014 showed normal LV function. Carotid Dopplers in December of 2014 showed less than 39% bilateral stenosis. ABIs in December of 2014 showed no significant stenosis. Exercise treadmill in Dec 2014 showed no ST changes. Patient continued to have exertional chest pain. He was therefore scheduled for a cardiac catheterization which was performed in January of 2015. This revealed normal left main. 40% mid LAD. 80-90% mid circumflex. The circumflex was occluded distally. The distal vessel filled from collaterals. There was a 90% ostial second obtuse marginal. There was a 90% mid right coronary artery. Ejection fraction normal. Patient had PCI of the right coronary artery and circumflex. Patient continued to have some exertional chest pain. I reviewed the films with Dr Clifton James and medical therapy felt indicated. Angina felt secondary to distal circumflex with inadequate collaterals. Abd Ultrasound 10/15 showed no aneuyrsm. Since I last saw him there is no dyspnea, palpitations or syncope. Some chest tightness with initial activities that resolves with continuing. Unchanged compared to previous.  Current Outpatient Prescriptions  Medication Sig Dispense Refill  . acetaminophen (TYLENOL) 325 MG tablet Take 2 tablets (650 mg total) by mouth every 4 (four) hours as needed for headache or mild pain.    Marland Kitchen amLODipine (NORVASC) 10 MG tablet TAKE 1 TABLET BY MOUTH ONCE DAILY 90 tablet 0  . aspirin EC 81 MG tablet Take 81 mg by mouth daily.     . calcium carbonate 200 MG capsule Take 250 mg by mouth daily.    Marland Kitchen CALCIUM PO Take 1 tablet by mouth daily.    . Cholecalciferol (VITAMIN D3) 5000 UNITS TABS Take 1 tablet by mouth daily.     . CRESTOR 20 MG tablet TAKE 1 TABLET BY MOUTH EVERY EVENING 90 tablet 1  . dutasteride (AVODART) 0.5 MG capsule Take 0.5 mg by mouth every other day.    . Glucosamine Sulfate 750 MG TABS Take 1 tablet by mouth daily.     Marland Kitchen  glucosamine-chondroitin 500-400 MG tablet Take 1 tablet by mouth daily.    . metoprolol succinate (TOPROL-XL) 50 MG 24 hr tablet Take 25 mg by mouth every evening. Takes in the evening due to fatigue experienced during the day.    . Multiple Vitamin (MULTIVITAMIN) tablet Take 1 tablet by mouth daily.    . nitroGLYCERIN (NITROSTAT) 0.4 MG SL tablet Place 1 tablet (0.4 mg total) under the tongue every 5 (five) minutes as needed for chest pain. 25 tablet 2  . Probiotic Product (PROBIOTIC DAILY PO) Take 1 capsule by mouth daily.      No current facility-administered medications for this visit.     Past Medical History  Diagnosis Date  . HLD (hyperlipidemia)     "started RX mid December 2014"  . Back pain   . HTN (hypertension)     "started RX mid December 2014"  . Anginal pain   . Coronary artery disease 11/02/13    RCA/ CFX DES  . Chronic bronchitis     "get it q yr" (11/01/2013)  . Exertional shortness of breath     "related to this heart issue" (11/01/2013)  . Arthritis     "maybe back and shoulders" (11/01/2013)  . Gout attack     "once" (11/01/2013)  . RBBB   . BPH (benign prostatic hyperplasia)   . PVD (peripheral vascular disease) 12/14    mild ICA disease    Past Surgical History  Procedure Laterality Date  .  Cervical disc surgery  ~ 2005  . Knee surgery Right 1976    "open; torn cartilage"  . Tonsillectomy    . Coronary angioplasty with stent placement  11/01/2013    RCA and CFX  . Inguinal hernia repair Left 2000's  . Left heart catheterization with coronary angiogram N/A 11/01/2013    Procedure: LEFT HEART CATHETERIZATION WITH CORONARY ANGIOGRAM;  Surgeon: Kathleene Hazelhristopher D McAlhany, MD;  [facility];  Service: Cardiovascular;  Laterality: N/A;    History   Social History  . Marital Status: Married    Spouse Name: N/A  . Number of Children: 3  . Years of Education: N/A   Occupational History  .      Construction   Social History Main Topics  . Smoking  status: Former Smoker -- 0.50 packs/day for 5 years    Types: Cigarettes  . Smokeless tobacco: Never Used     Comment: 11/01/2013 "stopped smoking in the 1980's"  . Alcohol Use: 8.4 oz/week    14 Cans of beer per week     Comment: 11/01/2013 "2 beers per night"  . Drug Use: Not on file  . Sexual Activity: Yes   Other Topics Concern  . Not on file   Social History Narrative    ROS: no fevers or chills, productive cough, hemoptysis, dysphasia, odynophagia, melena, hematochezia, dysuria, hematuria, rash, seizure activity, orthopnea, PND, pedal edema, claudication. Remaining systems are negative.  Physical Exam: Well-developed well-nourished in no acute distress.  Skin is warm and dry.  HEENT is normal.  Neck is supple.  Chest is clear to auscultation with normal expansion.  Cardiovascular exam is regular rate and rhythm.  Abdominal exam nontender or distended. No masses palpated. Extremities show no edema. neuro grossly intact  ECG sinus bradycardia, right bundle branch block.

## 2015-02-08 LAB — BASIC METABOLIC PANEL WITH GFR
BUN: 15 mg/dL (ref 6–23)
CO2: 30 meq/L (ref 19–32)
CREATININE: 0.81 mg/dL (ref 0.50–1.35)
Calcium: 9.5 mg/dL (ref 8.4–10.5)
Chloride: 104 mEq/L (ref 96–112)
GFR, Est Non African American: >89 mL/min
Glucose, Bld: 79 mg/dL (ref 70–99)
POTASSIUM: 5.1 meq/L (ref 3.5–5.3)
Sodium: 140 mEq/L (ref 135–145)

## 2015-02-08 LAB — HEPATIC FUNCTION PANEL
ALBUMIN: 4.4 g/dL (ref 3.5–5.2)
ALK PHOS: 51 U/L (ref 39–117)
ALT: 22 U/L (ref 0–53)
AST: 27 U/L (ref 0–37)
BILIRUBIN TOTAL: 1.1 mg/dL (ref 0.2–1.2)
Bilirubin, Direct: 0.3 mg/dL (ref 0.0–0.3)
Indirect Bilirubin: 0.8 mg/dL (ref 0.2–1.2)
Total Protein: 6.4 g/dL (ref 6.0–8.3)

## 2015-02-08 LAB — LIPID PANEL
Cholesterol: 118 mg/dL (ref 0–200)
HDL: 51 mg/dL (ref >=40)
LDL Cholesterol: 52 mg/dL (ref 0–99)
Total CHOL/HDL Ratio: 2.3 Ratio
Triglycerides: 73 mg/dL (ref <150)
VLDL: 15 mg/dL (ref 0–40)

## 2015-02-12 ENCOUNTER — Ambulatory Visit (INDEPENDENT_AMBULATORY_CARE_PROVIDER_SITE_OTHER): Payer: 59 | Admitting: Cardiology

## 2015-02-12 ENCOUNTER — Encounter: Payer: Self-pay | Admitting: Cardiology

## 2015-02-12 VITALS — BP 124/70 | HR 55 | Ht 72.0 in | Wt 182.1 lb

## 2015-02-12 DIAGNOSIS — I2583 Coronary atherosclerosis due to lipid rich plaque: Secondary | ICD-10-CM

## 2015-02-12 DIAGNOSIS — E785 Hyperlipidemia, unspecified: Secondary | ICD-10-CM

## 2015-02-12 DIAGNOSIS — I10 Essential (primary) hypertension: Secondary | ICD-10-CM

## 2015-02-12 DIAGNOSIS — I2510 Atherosclerotic heart disease of native coronary artery without angina pectoris: Secondary | ICD-10-CM | POA: Diagnosis not present

## 2015-02-12 NOTE — Patient Instructions (Signed)
Your physician wants you to follow-up in: ONE YEAR WITH DR CRENSHAW You will receive a reminder letter in the mail two months in advance. If you don't receive a letter, please call our office to schedule the follow-up appointment.  

## 2015-02-12 NOTE — Assessment & Plan Note (Signed)
Plan continue aspirin and statin. His angina is relatively well controlled. I previously reviewed his films with Dr Clifton JamesMcalhany. We feel medical therapy is best option. Continue Toprol. Continue amlodipine.

## 2015-02-12 NOTE — Assessment & Plan Note (Signed)
Continue statin. 

## 2015-02-12 NOTE — Assessment & Plan Note (Signed)
Blood pressure controlled. Continue present medications. 

## 2015-03-05 ENCOUNTER — Telehealth: Payer: Self-pay | Admitting: Cardiology

## 2015-03-05 ENCOUNTER — Other Ambulatory Visit: Payer: Self-pay | Admitting: Cardiology

## 2015-03-05 NOTE — Telephone Encounter (Signed)
Pt need to have emergency dental surgery,he needs to talk about his medicine.

## 2015-03-05 NOTE — Telephone Encounter (Signed)
Phone goes straight to VM. Left message for patient to return call.

## 2015-03-05 NOTE — Telephone Encounter (Signed)
Continue ASA.ok for procedure Juan MillersBrian Amoni Morales

## 2015-03-05 NOTE — Telephone Encounter (Signed)
Patient advised OK to continue all meds, OK for procedure. Instructed to call if needing faxed clearance letter.

## 2015-03-05 NOTE — Telephone Encounter (Signed)
Pt needs to schedule dental surgery for a tooth extraction at root and stitching to gums. Wanted OK/med instruction.  Verified current medications.  DES 1/15, on ASA 81mg , no NOAC or antiplatelet therapy.

## 2015-03-06 NOTE — Telephone Encounter (Signed)
Per note 5.3.16  

## 2015-04-16 ENCOUNTER — Other Ambulatory Visit: Payer: Self-pay | Admitting: Cardiology

## 2015-04-16 ENCOUNTER — Other Ambulatory Visit: Payer: Self-pay | Admitting: *Deleted

## 2015-04-16 MED ORDER — NITROGLYCERIN 0.4 MG SL SUBL: 0.4000 mg | SUBLINGUAL_TABLET | SUBLINGUAL | Status: DC | PRN

## 2015-04-16 NOTE — Telephone Encounter (Signed)
Rx(s) sent to pharmacy electronically.  

## 2015-07-10 ENCOUNTER — Encounter: Payer: Self-pay | Admitting: Cardiology

## 2015-07-29 ENCOUNTER — Other Ambulatory Visit: Payer: Self-pay | Admitting: Cardiology

## 2015-08-15 ENCOUNTER — Encounter: Payer: Self-pay | Admitting: Cardiology

## 2015-10-09 ENCOUNTER — Encounter: Payer: Self-pay | Admitting: Physician Assistant

## 2015-10-09 ENCOUNTER — Ambulatory Visit (INDEPENDENT_AMBULATORY_CARE_PROVIDER_SITE_OTHER): Payer: 59 | Admitting: Physician Assistant

## 2015-10-09 VITALS — BP 132/84 | HR 62 | Ht 72.0 in | Wt 188.6 lb

## 2015-10-09 DIAGNOSIS — Z01818 Encounter for other preprocedural examination: Secondary | ICD-10-CM

## 2015-10-09 DIAGNOSIS — I208 Other forms of angina pectoris: Secondary | ICD-10-CM

## 2015-10-09 MED ORDER — ISOSORBIDE MONONITRATE ER 30 MG PO TB24: 30.0000 mg | ORAL_TABLET | Freq: Every day | ORAL | Status: DC

## 2015-10-09 MED ORDER — CRESTOR 20 MG PO TABS: 20.0000 mg | ORAL_TABLET | Freq: Every evening | ORAL | Status: DC

## 2015-10-09 MED ORDER — NITROGLYCERIN 0.4 MG SL SUBL: 0.4000 mg | SUBLINGUAL_TABLET | SUBLINGUAL | Status: DC | PRN

## 2015-10-09 NOTE — Progress Notes (Signed)
Cardiology Office Note   Date:  10/09/2015   ID:  Juan Henderson, DOB 1951/07/11, MRN [id number]  PCP:  Karle Plumber, MD  Cardiologist:  Dr Rosemarie Beath, PA-C   Chief Complaint  Patient presents with  . Follow-up    CAD//surgical clearance for rotator cuff--pt states he has regular chest pain, occasional dizziness when he first wakes up in the morning//no other Sx.    History of Present Illness: Juan Henderson is a 64 y.o. male with a history of HTN, HL, DES RCA 09/2014, carotid DZ, BPH, RBBB.  Juan Henderson presents for preoperative evaluation for rotator cuff surgery.  Juan Henderson is active on a daily basis. He walks daily for exercise. After walking about a half a mile he will get chest pain. It will last about 10 minutes and he will continue to walk. It will reach a 3 or 4/10 and the chest pain will eventually resolve. He has not taken nitroglycerin for it. It is the same chest pain that he was having before his stent and has not really changed very much, but he feels he is tolerating it well. He does not have any problem walking on flat ground. He will get dyspnea if he tries to walk up a hill that is significant but states he can walk a flight of stairs without any problem.   His symptoms have not changed any in the last year. The chest pain has not been severe enough or prolonged enough to warrant nitroglycerin use although he does keep a fresh bottle with him. His dyspnea on exertion has not changed any. In general, his activity level and exercise tolerance is very good.   Past Medical History  Diagnosis Date  . HLD (hyperlipidemia)     "started RX mid December 2014"  . Back pain   . HTN (hypertension)     "started RX mid December 2014"  . Anginal pain (HCC)   . Coronary artery disease 11/02/13    RCA/ CFX DES; LAD 40%, distal CFX 100%, med rx, EF 65%  . Chronic bronchitis (HCC)        . Exertional shortness of breath        . Arthritis     "maybe back  and shoulders" (11/01/2013)  . Gout attack        . RBBB   . BPH (benign prostatic hyperplasia)   . PVD (peripheral vascular disease) (HCC) 12/14    mild ICA disease    Past Surgical History  Procedure Laterality Date  . Cervical disc surgery  ~ 2005  . Knee surgery Right 1976    "open; torn cartilage"  . Tonsillectomy    . Coronary angioplasty with stent placement  11/01/2013    RCA and CFX  . Inguinal hernia repair Left 2000's  . Left heart catheterization with coronary angiogram N/A 11/01/2013    Procedure: LEFT HEART CATHETERIZATION WITH CORONARY ANGIOGRAM;  Surgeon: Kathleene Hazel, MD;  Location: [facility] CATH LAB;  Service: Cardiovascular;  Laterality: N/A;    Current Outpatient Prescriptions  Medication Sig Dispense Refill  . acetaminophen (TYLENOL) 325 MG tablet Take 2 tablets (650 mg total) by mouth every 4 (four) hours as needed for headache or mild pain.    Marland Kitchen amLODipine (NORVASC) 10 MG tablet TAKE 1 TABLET BY MOUTH DAILY 90 tablet 2  . aspirin EC 81 MG tablet Take 81 mg by mouth daily.     . calcium carbonate 200 MG capsule Take 250  mg by mouth daily.    Marland Kitchen CALCIUM PO Take 1 tablet by mouth daily.    . Cholecalciferol (VITAMIN D3) 5000 UNITS TABS Take 1 tablet by mouth daily.     . CRESTOR 20 MG tablet Take 1 tablet (20 mg total) by mouth every evening. 90 tablet 3  . dutasteride (AVODART) 0.5 MG capsule Take 0.5 mg by mouth every other day.    . Glucosamine Sulfate 750 MG TABS Take 1 tablet by mouth daily.     Marland Kitchen glucosamine-chondroitin 500-400 MG tablet Take 1 tablet by mouth daily.    . metoprolol succinate (TOPROL-XL) 50 MG 24 hr tablet TAKE 1 TABLET BY MOUTH DAILY (Patient taking differently: Take 1/2 tab by mouth daily.) 90 tablet 1  . Multiple Vitamin (MULTIVITAMIN) tablet Take 1 tablet by mouth daily.    . nitroGLYCERIN (NITROSTAT) 0.4 MG SL tablet Place 1 tablet (0.4 mg total) under the tongue every 5 (five) minutes as needed for chest pain. 25 tablet 2  .  Probiotic Product (PROBIOTIC DAILY PO) Take 1 capsule by mouth daily.      No current facility-administered medications for this visit.    Allergies:   Review of patient's allergies indicates no known allergies.    Social History:  The patient  reports that he has quit smoking. His smoking use included Cigarettes. He has a 2.5 pack-year smoking history. He has never used smokeless tobacco. He reports that he drinks about 8.4 oz of alcohol per week.   Family History:  The patient's family history includes Hypertension in his father.    ROS:  Please see the history of present illness. All other systems are reviewed and negative.    PHYSICAL EXAM: VS:  BP 132/84 mmHg  Pulse 62  Ht 6' (1.829 m)  Wt 188 lb 9.6 oz (85.548 kg)  BMI 25.57 kg/m2 , BMI Body mass index is 25.57 kg/(m^2). GEN: Well nourished, well developed, male in no acute distress HEENT: normal for age  Neck: no JVD, no carotid bruit, no masses Cardiac: RRR; no murmur, no rubs, or gallops Respiratory:  clear to auscultation bilaterally, normal work of breathing GI: soft, nontender, nondistended, + BS MS: no deformity or atrophy; no edema; distal pulses are 2+ in all 4 extremities  Skin: warm and dry, no rash Neuro:  Strength and sensation are intact Psych: euthymic mood, full affect   EKG:  EKG is ordered today. The ekg ordered today demonstrates sinus rhythm, rate 62 with a right bundle branch block that is old.   Recent Labs: 02/07/2015: ALT 22; BUN 15; Creat 0.81; Potassium 5.1; Sodium 140    Lipid Panel    Component Value Date/Time   CHOL 118 02/07/2015 0952   TRIG 73 02/07/2015 0952   HDL 51 02/07/2015 0952   CHOLHDL 2.3 02/07/2015 0952   VLDL 15 02/07/2015 0952   LDLCALC 52 02/07/2015 0952     Wt Readings from Last 3 Encounters:  10/09/15 188 lb 9.6 oz (85.548 kg)  02/12/15 182 lb 1.6 oz (82.6 kg)  07/30/14 180 lb 6.4 oz (81.829 kg)     Other studies Reviewed: Additional studies/ records that  were reviewed today include: Office Notes and hospital records.  ASSESSMENT AND PLAN:  1.  Chronic stable angina: The patient is aware that his distal circumflex was occluded and and has worked at maintaining his activity level and fitness. He is on aspirin, beta blocker, and statin. His EF is preserved. His blood pressure does not  warrant a change in his beta blocker. His lipid profile shows good control. He is on the maximum dose of amlodipine. In order to treat his symptoms and perhaps make him able to tolerate surgery better, we will add Imdur to his medication regimen, starting at one half tablet daily and increasing to 1 tablet daily.  2. Preoperative evaluation: He now needs rotator cuff surgery. It is presumed this will involve general anesthesia.   He is at moderate surgical risk from a cardiovascular standpoint but it is not prohibitive. His cardiac status is stable. There is no further evaluation or workup needed prior to the planned surgery. We will recommend holding his aspirin no more than 5 days and continuing his beta blocker and nitrates in the perioperative period.   Current medicines are reviewed at length with the patient today.  The patient does not have concerns regarding medicines.  The following changes have been made: Add Imdur  Labs/ tests ordered today include:   Orders Placed This Encounter  Procedures  . EKG 12-Lead     Disposition:   FU with Dr. Jens Somrenshaw  Signed, Leanna BattlesBarrett, Kiah Vanalstine, PA-C  10/09/2015 5:13 PM    [facility] [address], [place], [place]  [address] Phone: [phone]; Fax: [phone]

## 2015-10-09 NOTE — Patient Instructions (Signed)
Your physician recommends that you schedule a follow-up appointment in: 3 Months with Dr Royann Shiversroitoru  Your physician has recommended you make the following change in your medication: START Isosorbide 30 mg take 1/2 tablets for 5 days then increase to 1 tablets a day  You are cleared for surgery a letter will be send to Dr Thurston HoleWainer office

## 2015-10-16 ENCOUNTER — Telehealth: Payer: Self-pay | Admitting: *Deleted

## 2015-10-16 NOTE — Telephone Encounter (Signed)
Juan Henderson office note containing clearance faxed

## 2015-10-21 ENCOUNTER — Encounter: Payer: Self-pay | Admitting: Physician Assistant

## 2015-10-22 ENCOUNTER — Encounter: Payer: Self-pay | Admitting: Physician Assistant

## 2015-11-13 ENCOUNTER — Telehealth: Payer: Self-pay

## 2015-11-13 NOTE — Telephone Encounter (Signed)
Brand Crestor denied by [facility]. Spoke with patient today, and he is already taking Rosuvastatin, without a problem. He also states his company is switching to a [employer] as of today. He knows we will need copies of this card.

## 2016-01-06 ENCOUNTER — Ambulatory Visit (INDEPENDENT_AMBULATORY_CARE_PROVIDER_SITE_OTHER): Payer: Commercial Managed Care - PPO | Admitting: Cardiovascular Disease

## 2016-01-06 ENCOUNTER — Encounter: Payer: Self-pay | Admitting: Cardiovascular Disease

## 2016-01-06 VITALS — BP 112/62 | HR 60 | Ht 72.0 in | Wt 188.6 lb

## 2016-01-06 DIAGNOSIS — E785 Hyperlipidemia, unspecified: Secondary | ICD-10-CM

## 2016-01-06 DIAGNOSIS — I208 Other forms of angina pectoris: Secondary | ICD-10-CM

## 2016-01-06 DIAGNOSIS — I739 Peripheral vascular disease, unspecified: Secondary | ICD-10-CM

## 2016-01-06 DIAGNOSIS — I25118 Atherosclerotic heart disease of native coronary artery with other forms of angina pectoris: Secondary | ICD-10-CM | POA: Diagnosis not present

## 2016-01-06 DIAGNOSIS — I10 Essential (primary) hypertension: Secondary | ICD-10-CM

## 2016-01-06 NOTE — Patient Instructions (Signed)
Your physician recommends that you continue on your current medications as directed. Please refer to the Current Medication list given to you today.  Dr Royann Shiversroitoru recommends that you schedule a follow-up appointment in 6 months with Dr Jens Somrenshaw. You will receive a reminder letter in the mail two months in advance. If you don't receive a letter, please call our office to schedule the follow-up appointment.  If you need a refill on your cardiac medications before your next appointment, please call your pharmacy.

## 2016-01-06 NOTE — Progress Notes (Signed)
Patient ID: Juan Henderson, male   DOB: Oct 18, 1950, 65 y.o.   MRN: [id number]     Cardiology Office Note    Date:  01/06/2016   ID:  Juan Henderson, DOB [birth year], MRN [id number]  PCP:  Karle Plumber, MD  Cardiologist:   Thurmon Fair, MD   Chief Complaint  Patient presents with  . Follow-up    3 months per rhonda  pt c/o occasional dizziness--less than once a month; no other Sx.    History of Present Illness:  Juan Henderson is a 65 y.o. male with a long-standing history of coronary artery disease and stable angina pectoris, status post drug-eluting stent to the right coronary artery and oblique marginal branch in January 2015, returning for follow-up after he underwent rotator cuff shoulder surgery. He has recovered well from that procedure, still receiving physical therapy. He is back at work.  If anything, his angina has improved. Just yesterday he [place] 4 miles without ever experiencing chest discomfort. He is on 3 different antianginal medications. He seems to have CCS class I-II exertional angina. He denies exertional dyspnea, palpitations or syncope, leg edema, focal neurological complaints, intermittent claudication or other cardiovascular problems.  Labs checked in January show total cholesterol of 133, LDL 59, HDL 64, triglycerides 49, creatinine 1.1 and normal liver function tests.    Past Medical History  Diagnosis Date  . HLD (hyperlipidemia)     "started RX mid December 2014"  . Back pain   . HTN (hypertension)     "started RX mid December 2014"  . Anginal pain (HCC)   . Coronary artery disease 11/02/13    RCA/ CFX DES; LAD 40%, distal CFX 100%, med rx, EF 65%  . Chronic bronchitis (HCC)        . Exertional shortness of breath        . Arthritis     "maybe back and shoulders" (11/01/2013)  . Gout attack        . RBBB   . BPH (benign prostatic hyperplasia)   . PVD (peripheral vascular disease) (HCC) 12/14    mild ICA disease    Past  Surgical History  Procedure Laterality Date  . Cervical disc surgery  ~ 2005  . Knee surgery Right 1976    "open; torn cartilage"  . Tonsillectomy    . Coronary angioplasty with stent placement  11/01/2013    RCA and CFX  . Inguinal hernia repair Left 2000's  . Left heart catheterization with coronary angiogram N/A 11/01/2013    Procedure: LEFT HEART CATHETERIZATION WITH CORONARY ANGIOGRAM;  Surgeon: Kathleene Hazel, MD;  Location: [facility] CATH LAB;  Service: Cardiovascular;  Laterality: N/A;    Outpatient Prescriptions Prior to Visit  Medication Sig Dispense Refill  . acetaminophen (TYLENOL) 325 MG tablet Take 2 tablets (650 mg total) by mouth every 4 (four) hours as needed for headache or mild pain.    Marland Kitchen amLODipine (NORVASC) 10 MG tablet TAKE 1 TABLET BY MOUTH DAILY 90 tablet 2  . aspirin EC 81 MG tablet Take 81 mg by mouth daily.     . calcium carbonate 200 MG capsule Take 250 mg by mouth daily.    Marland Kitchen CALCIUM PO Take 1 tablet by mouth daily.    . Cholecalciferol (VITAMIN D3) 5000 UNITS TABS Take 1 tablet by mouth daily.     . CRESTOR 20 MG tablet Take 1 tablet (20 mg total) by mouth every evening. 90 tablet 3  . dutasteride (AVODART)  0.5 MG capsule Take 0.5 mg by mouth every other day.    . Glucosamine Sulfate 750 MG TABS Take 1 tablet by mouth daily.     Marland Kitchen. glucosamine-chondroitin 500-400 MG tablet Take 1 tablet by mouth daily.    . isosorbide mononitrate (IMDUR) 30 MG 24 hr tablet Take 1 tablet (30 mg total) by mouth daily. 90 tablet 3  . Multiple Vitamin (MULTIVITAMIN) tablet Take 1 tablet by mouth daily.    . nitroGLYCERIN (NITROSTAT) 0.4 MG SL tablet Place 1 tablet (0.4 mg total) under the tongue every 5 (five) minutes as needed for chest pain. 25 tablet 2  . Probiotic Product (PROBIOTIC DAILY PO) Take 1 capsule by mouth daily.     . metoprolol succinate (TOPROL-XL) 50 MG 24 hr tablet TAKE 1 TABLET BY MOUTH DAILY (Patient taking differently: Take 1/2 tab by mouth daily.) 90 tablet  1   No facility-administered medications prior to visit.     Allergies:   Review of patient's allergies indicates no known allergies.   Social History   Social History  . Marital Status: Married    Spouse Name: N/A  . Number of Children: 3  . Years of Education: N/A   Occupational History  .      Construction   Social History Main Topics  . Smoking status: Former Smoker -- 0.50 packs/day for 5 years    Types: Cigarettes  . Smokeless tobacco: Never Used     Comment: 11/01/2013 "stopped smoking in the 1980's"  . Alcohol Use: 8.4 oz/week    14 Cans of beer per week     Comment: 11/01/2013 "2 beers per night"  . Drug Use: None  . Sexual Activity: Yes   Other Topics Concern  . None   Social History Narrative     Family History:  The patient's family history includes Hypertension in his father.   ROS:   Please see the history of present illness.    ROS All other systems reviewed and are negative.   PHYSICAL EXAM:   VS:  BP 112/62 mmHg  Pulse 60  Ht 6' (1.829 m)  Wt 85.548 kg (188 lb 9.6 oz)  BMI 25.57 kg/m2   GEN: Well nourished, well developed, in no acute distress HEENT: normal Neck: no JVD, carotid bruits, or masses Cardiac: RRR, S4 present; no murmurs, rubs, or gallops,no edema  Respiratory:  clear to auscultation bilaterally, normal work of breathing GI: soft, nontender, nondistended, + BS MS: no deformity or atrophy Skin: warm and dry, no rash Neuro:  Alert and Oriented x 3, Strength and sensation are intact Psych: euthymic mood, full affect  Wt Readings from Last 3 Encounters:  01/06/16 85.548 kg (188 lb 9.6 oz)  10/09/15 85.548 kg (188 lb 9.6 oz)  02/12/15 82.6 kg (182 lb 1.6 oz)      Studies/Labs Reviewed:   EKG:  EKG is not ordered today.    Recent Labs: 02/07/2015: ALT 22; BUN 15; Creat 0.81; Potassium 5.1; Sodium 140   Lipid Panel    Component Value Date/Time   CHOL 118 02/07/2015 0952   TRIG 73 02/07/2015 0952   HDL 51 02/07/2015  0952   CHOLHDL 2.3 02/07/2015 0952   VLDL 15 02/07/2015 0952   LDLCALC 52 02/07/2015 0952   Jan 2017: total cholesterol of 133, LDL 59, HDL 64, triglycerides 49, creatinine 1.1 and normal liver function tests.   ASSESSMENT:    1. Chronic stable angina (HCC)   2. Coronary artery disease  involving native coronary artery of native heart with other form of angina pectoris (HCC)   3. Hyperlipidemia   4. Essential hypertension   5. PVD (peripheral vascular disease)-<39% Bilat ICA      PLAN:  In order of problems listed above:  1. Stable angina pectoris: CCS class I-II, active lifestyle. Continue amlodipine, metoprolol and isosorbide. 2. CAD s/ p PCI-DES: Should be well beyond the risk of restenosis at this point 3. HLP: Excellent lipid profile 4. HTN: Great blood pressure control 5. Mild carotid plaque, asymptomatic  Follow-up with Dr. Olga Millers in 6 months.  Medication Adjustments/Labs and Tests Ordered: Current medicines are reviewed at length with the patient today.  Concerns regarding medicines are outlined above.  Medication changes, Labs and Tests ordered today are listed in the Patient Instructions below. Patient Instructions  Your physician recommends that you continue on your current medications as directed. Please refer to the Current Medication list given to you today.  Dr Royann Shivers recommends that you schedule a follow-up appointment in 6 months with Dr Jens Som. You will receive a reminder letter in the mail two months in advance. If you don't receive a letter, please call our office to schedule the follow-up appointment.  If you need a refill on your cardiac medications before your next appointment, please call your pharmacy.      Joie Bimler, MD  01/06/2016 11:06 AM    [facility] [address] [place], [place], [place]  [address] Phone: [phone]; Fax: [phone]

## 2016-01-08 ENCOUNTER — Ambulatory Visit: Payer: Self-pay | Admitting: Cardiovascular Disease

## 2016-01-24 ENCOUNTER — Other Ambulatory Visit: Payer: Self-pay | Admitting: Cardiology

## 2016-01-24 ENCOUNTER — Encounter: Payer: Self-pay | Admitting: Cardiology

## 2016-01-24 NOTE — Telephone Encounter (Signed)
REFILL 

## 2016-03-23 ENCOUNTER — Other Ambulatory Visit: Payer: Self-pay

## 2016-03-23 MED ORDER — METOPROLOL SUCCINATE ER 50 MG PO TB24: 25.0000 mg | ORAL_TABLET | Freq: Every day | ORAL | Status: DC

## 2016-05-01 IMAGING — CT CT HEAD W/O CM
1 series · 16 of 30 positions shown, 20 images · non-contrast
Comparison: None

CLINICAL DATA: Fell backward from a crouched position yesterday at
work striking occiput, now with occipital headache today, feels off
balance, small soft tissue hematoma, on Plavix

EXAM:
CT HEAD WITHOUT CONTRAST
TECHNIQUE: Contiguous axial images were obtained from the base of the skull
through the vertex without intravenous contrast.

[Series 2: head 5.0 h30s · axial · 0.46mm/px · z∈[-109,+31]mm · 16 of 32 slices shown, 20 images]
[im 2/32  brain]
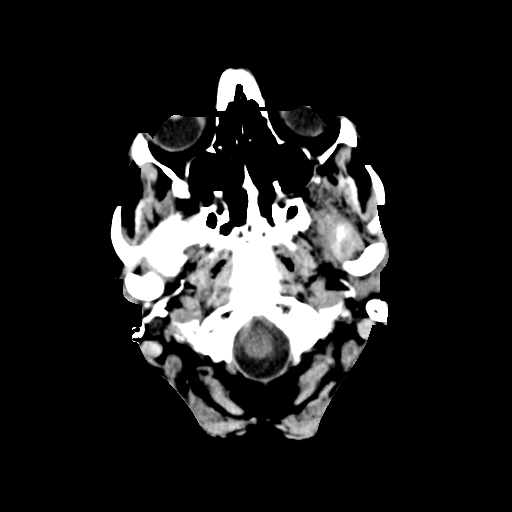
[im 2/32  bone]
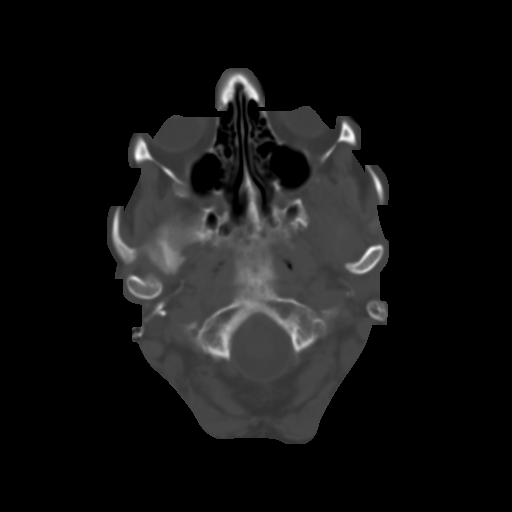
[im 4/32  brain]
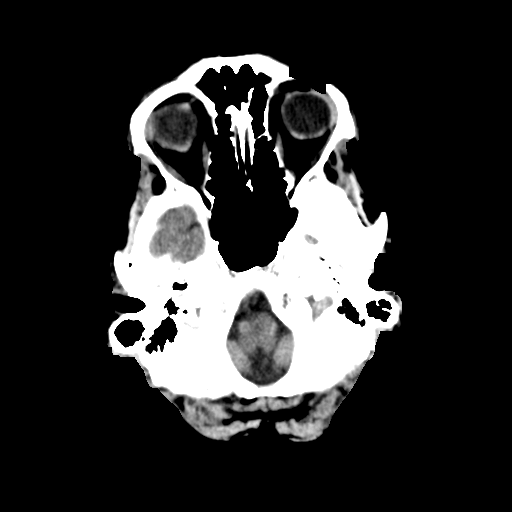
[im 6/32  brain]
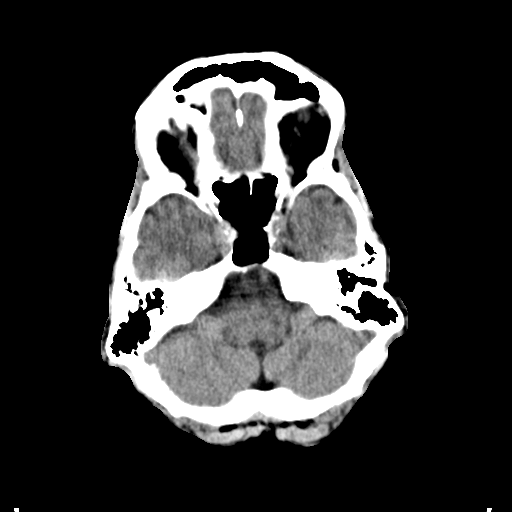
[im 8/32  brain]
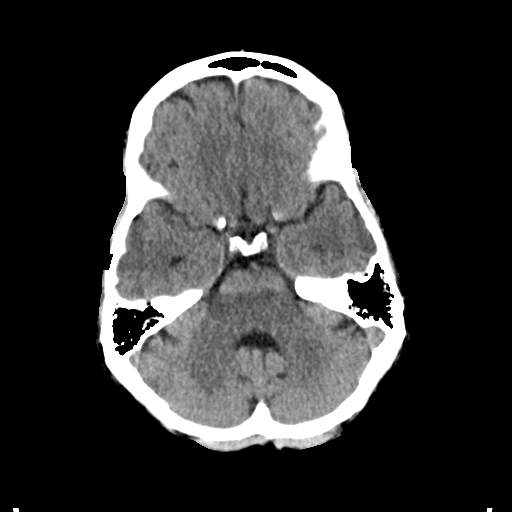
[im 9/32  brain]
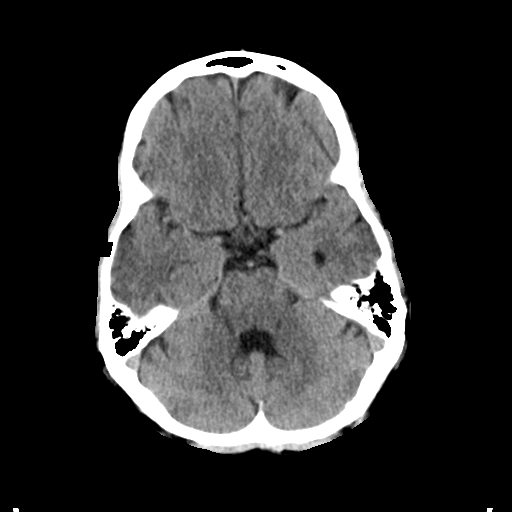
[im 9/32  bone]
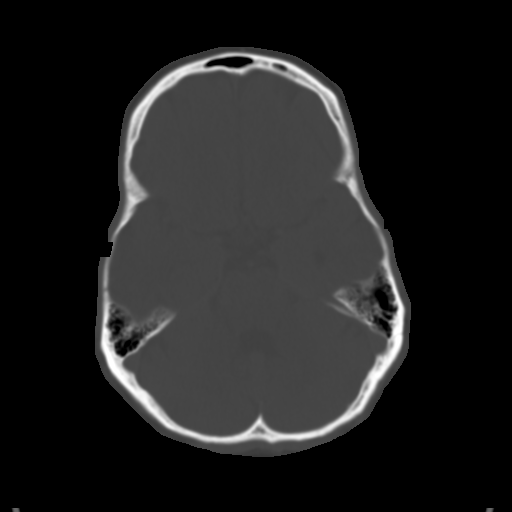
[im 11/32  brain]
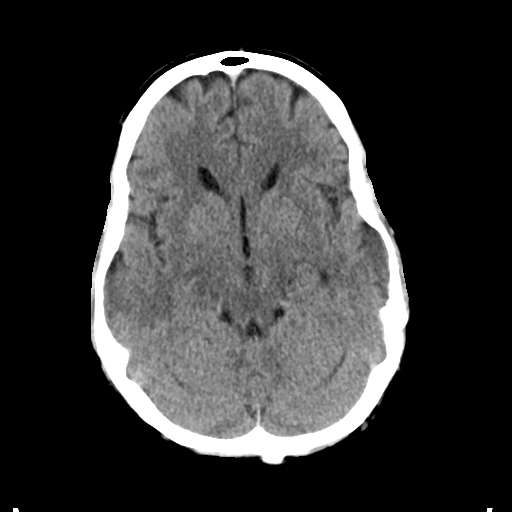
[im 13/32  brain]
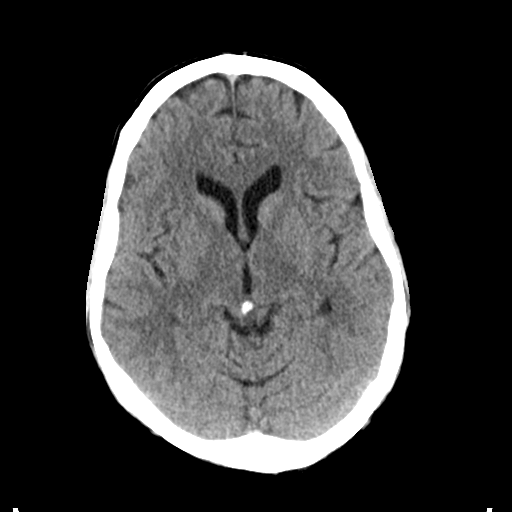
[im 15/32  brain]
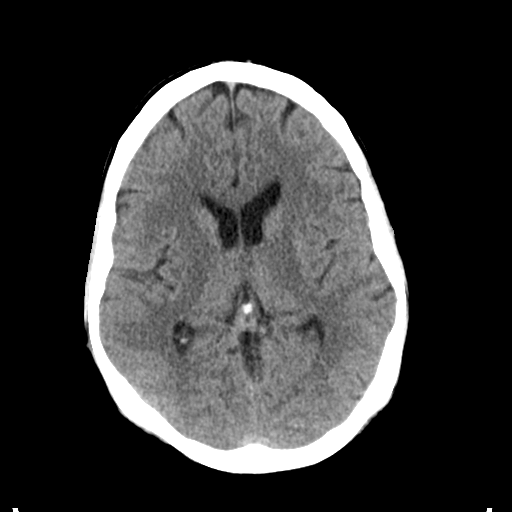
[im 17/32  brain]
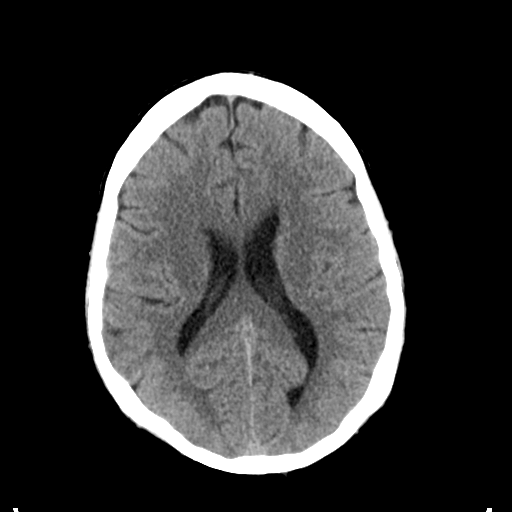
[im 17/32  bone]
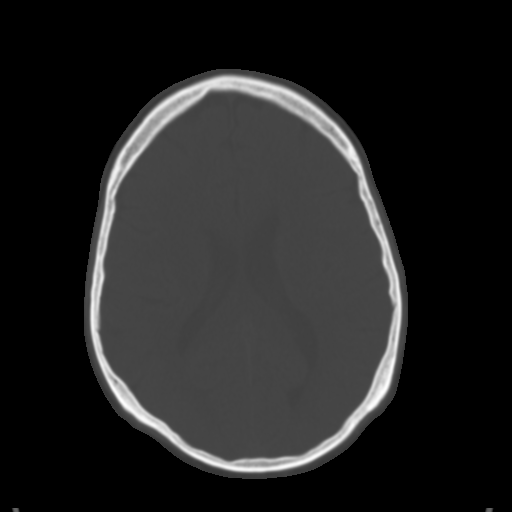
[im 19/32  brain]
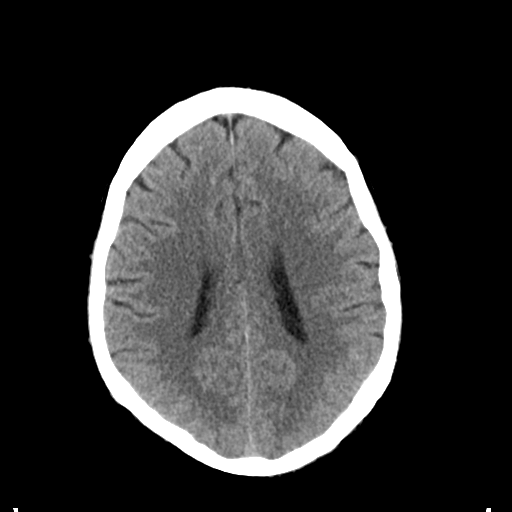
[im 21/32  brain]
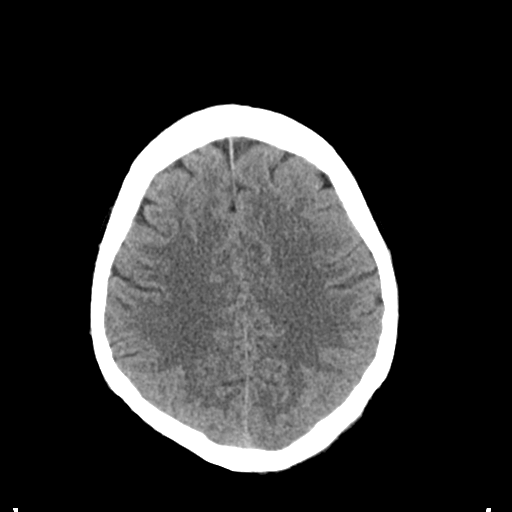
[im 23/32  brain]
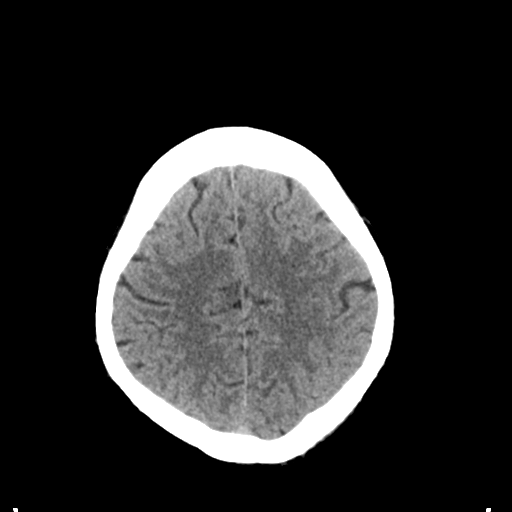
[im 24/32  brain]
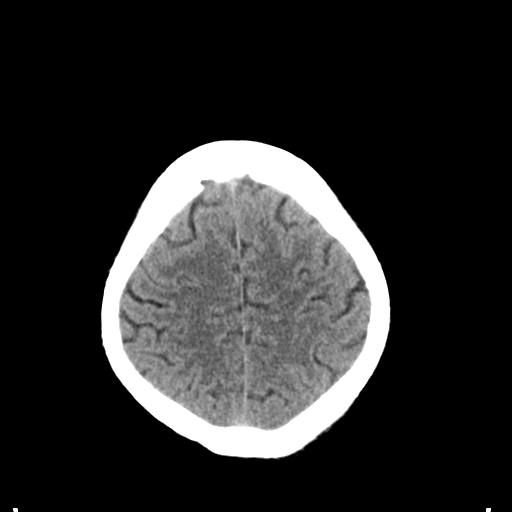
[im 24/32  bone]
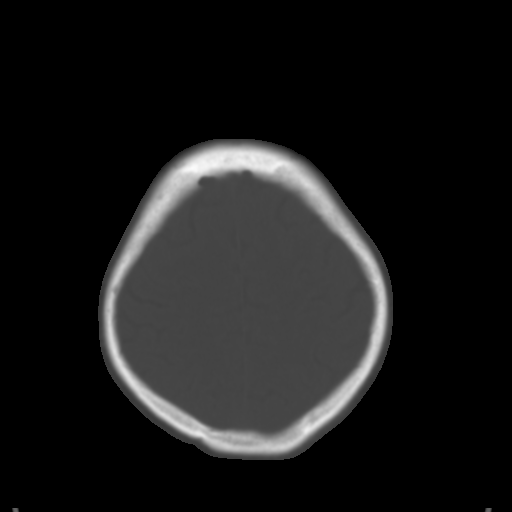
[im 26/32  brain]
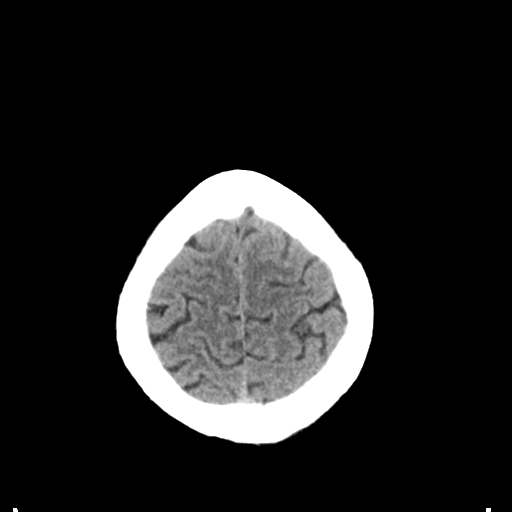
[im 28/32  brain]
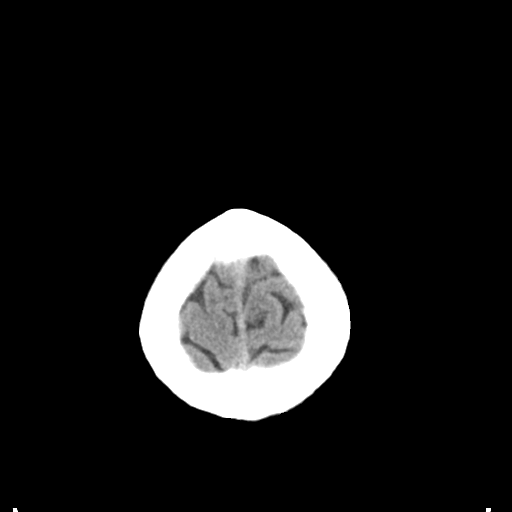
[im 30/32  brain]
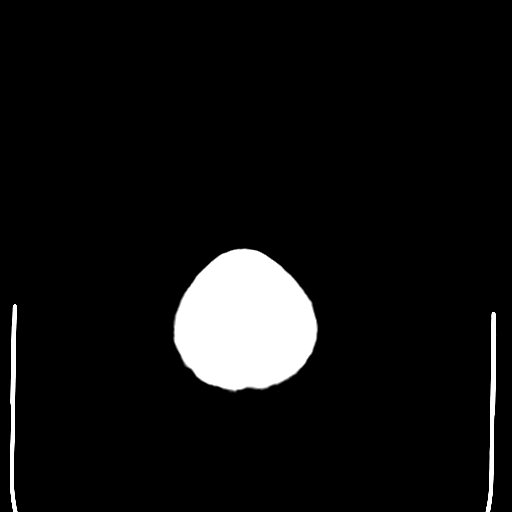

[16 of 30 positions shown; findings below may reference images not displayed]

FINDINGS: Normal ventricular morphology.

No midline shift or mass effect.

Normal appearance of brain parenchyma.

No intracranial hemorrhage, mass lesion, or acute infarction.

Visualized paranasal sinuses and mastoid air cells clear.

Bones unremarkable.
IMPRESSION: Normal exam.

## 2016-07-16 NOTE — Progress Notes (Signed)
HPI: FU CAD. Echocardiogram in November of 2014 showed normal LV function. Carotid Dopplers in December of 2014 showed less than 39% bilateral stenosis. ABIs in December of 2014 showed no significant stenosis. Exercise treadmill in Dec 2014 showed no ST changes. Patient continued to have exertional chest pain. He was therefore scheduled for a cardiac catheterization which was performed in January of 2015. This revealed normal left main. 40% mid LAD. 80-90% mid circumflex. The circumflex was occluded distally. The distal vessel filled from collaterals. There was a 90% ostial second obtuse marginal. There was a 90% mid right coronary artery. Ejection fraction normal. Patient had PCI of the right coronary artery and circumflex. Patient continued to have some exertional chest pain. I reviewed the films with Dr Clifton James and medical therapy felt indicated. Angina felt secondary to distal circumflex with inadequate collaterals. Abd Ultrasound 10/15 showed no aneuyrsm. Since I last saw him he notes chest tightness with walking up hills initially which is unchanged. He does not have chest tightness with normal activities and denies dyspnea. No syncope.  Current Outpatient Prescriptions  Medication Sig Dispense Refill  . acetaminophen (TYLENOL) 325 MG tablet Take 2 tablets (650 mg total) by mouth every 4 (four) hours as needed for headache or mild pain.    Marland Kitchen amLODipine (NORVASC) 10 MG tablet TAKE 1 TABLET BY MOUTH DAILY 90 tablet 1  . aspirin EC 81 MG tablet Take 81 mg by mouth daily.     . calcium carbonate 200 MG capsule Take 250 mg by mouth daily.    Marland Kitchen CALCIUM PO Take 1 tablet by mouth daily.    . Cholecalciferol (VITAMIN D3) 5000 UNITS TABS Take 1 tablet by mouth daily.     . CRESTOR 20 MG tablet Take 1 tablet (20 mg total) by mouth every evening. 90 tablet 3  . dutasteride (AVODART) 0.5 MG capsule Take 0.5 mg by mouth every other day.    . Glucosamine Sulfate 750 MG TABS Take 1 tablet by mouth  daily.     Marland Kitchen glucosamine-chondroitin 500-400 MG tablet Take 1 tablet by mouth daily.    . isosorbide mononitrate (IMDUR) 30 MG 24 hr tablet Take 1 tablet (30 mg total) by mouth daily. 90 tablet 3  . metoprolol succinate (TOPROL-XL) 50 MG 24 hr tablet Take 1 tablet (50 mg total) by mouth daily. Take with or immediately following a meal. 90 tablet 3  . Multiple Vitamin (MULTIVITAMIN) tablet Take 1 tablet by mouth daily.    . nitroGLYCERIN (NITROSTAT) 0.4 MG SL tablet Place 1 tablet (0.4 mg total) under the tongue every 5 (five) minutes as needed for chest pain. 25 tablet 2  . Probiotic Product (PROBIOTIC DAILY PO) Take 1 capsule by mouth daily.      No current facility-administered medications for this visit.      Past Medical History:  Diagnosis Date  . Anginal pain (HCC)   . Arthritis    "maybe back and shoulders" (11/01/2013)  . Back pain   . BPH (benign prostatic hyperplasia)   . Chronic bronchitis (HCC)       . Coronary artery disease 11/02/13   RCA/ CFX DES; LAD 40%, distal CFX 100%, med rx, EF 65%  . Exertional shortness of breath       . Gout attack       . HLD (hyperlipidemia)    "started RX mid December 2014"  . HTN (hypertension)    "started RX mid December 2014"  .  PVD (peripheral vascular disease) (HCC) 12/14   mild ICA disease  . RBBB     Past Surgical History:  Procedure Laterality Date  . CERVICAL DISC SURGERY  ~ 2005  . CORONARY ANGIOPLASTY WITH STENT PLACEMENT  11/01/2013   RCA and CFX  . INGUINAL HERNIA REPAIR Left 2000's  . KNEE SURGERY Right 1976   "open; torn cartilage"  . LEFT HEART CATHETERIZATION WITH CORONARY ANGIOGRAM N/A 11/01/2013   Procedure: LEFT HEART CATHETERIZATION WITH CORONARY ANGIOGRAM;  Surgeon: Kathleene Hazelhristopher D McAlhany, MD;  [facility];  Service: Cardiovascular;  Laterality: N/A;  . TONSILLECTOMY      Social History   Social History  . Marital status: Married    Spouse name: N/A  . Number of children: 3  . Years of  education: N/A   Occupational History  .      Construction   Social History Main Topics  . Smoking status: Former Smoker    Packs/day: 0.50    Years: 5.00    Types: Cigarettes  . Smokeless tobacco: Never Used     Comment: 11/01/2013 "stopped smoking in the 1980's"  . Alcohol use 8.4 oz/week    14 Cans of beer per week     Comment: 11/01/2013 "2 beers per night"  . Drug use: Unknown  . Sexual activity: Yes   Other Topics Concern  . Not on file   Social History Narrative  . No narrative on file    Family History  Problem Relation Age of Onset  . Hypertension Father     ROS: no fevers or chills, productive cough, hemoptysis, dysphasia, odynophagia, melena, hematochezia, dysuria, hematuria, rash, seizure activity, orthopnea, PND, pedal edema, claudication. Remaining systems are negative.  Physical Exam: Well-developed well-nourished in no acute distress.  Skin is warm and dry.  HEENT is normal.  Neck is supple.  Chest is clear to auscultation with normal expansion.  Cardiovascular exam is regular rate and rhythm.  Abdominal exam nontender or distended. No masses palpated. Extremities show no edema. neuro grossly intact  ECG-Sinus bradycardia at a rate of 55. Right bundle branch block.   A/P  1 Coronary artery disease-continue aspirin and statin. Continue beta blocker, nitrates and amlodipine. I previously reviewed his films with Dr. Clifton JamesMcalhany and we felt medical therapy indicated.  2 hypertension-blood pressure controlled. Continue present medications.  3 hyperlipidemia-continue statin.  Olga MillersBrian Nixon Sparr, MD

## 2016-07-23 ENCOUNTER — Encounter: Payer: Self-pay | Admitting: Cardiology

## 2016-07-23 ENCOUNTER — Ambulatory Visit (INDEPENDENT_AMBULATORY_CARE_PROVIDER_SITE_OTHER): Payer: Commercial Managed Care - PPO | Admitting: Cardiology

## 2016-07-23 VITALS — BP 128/70 | HR 55 | Ht 72.0 in | Wt 185.6 lb

## 2016-07-23 DIAGNOSIS — E785 Hyperlipidemia, unspecified: Secondary | ICD-10-CM | POA: Diagnosis not present

## 2016-07-23 DIAGNOSIS — I2510 Atherosclerotic heart disease of native coronary artery without angina pectoris: Secondary | ICD-10-CM

## 2016-07-23 DIAGNOSIS — I10 Essential (primary) hypertension: Secondary | ICD-10-CM

## 2016-07-23 MED ORDER — NITROGLYCERIN 0.4 MG SL SUBL: 0.4000 mg | SUBLINGUAL_TABLET | SUBLINGUAL | 2 refills | Status: DC | PRN

## 2016-07-23 NOTE — Patient Instructions (Signed)
Your physician wants you to follow-up in: 1 YEAR OR SOONER IF NEEDED. You will receive a reminder letter in the mail two months in advance. If you don't receive a letter, please call our office to schedule the follow-up appointment.   If you need a refill on your cardiac medications before your next appointment, please call your pharmacy. 

## 2016-08-03 ENCOUNTER — Other Ambulatory Visit: Payer: Self-pay

## 2016-08-03 MED ORDER — AMLODIPINE BESYLATE 10 MG PO TABS: 10.0000 mg | ORAL_TABLET | Freq: Every day | ORAL | 2 refills | Status: DC

## 2016-10-27 ENCOUNTER — Other Ambulatory Visit: Payer: Self-pay | Admitting: Cardiology

## 2016-10-27 MED ORDER — CRESTOR 20 MG PO TABS: 20.0000 mg | ORAL_TABLET | Freq: Every evening | ORAL | 2 refills | Status: DC

## 2016-10-27 NOTE — Telephone Encounter (Signed)
Rx(s) sent to pharmacy electronically.  

## 2016-10-30 ENCOUNTER — Other Ambulatory Visit: Payer: Self-pay

## 2016-10-30 MED ORDER — ISOSORBIDE MONONITRATE ER 30 MG PO TB24: 30.0000 mg | ORAL_TABLET | Freq: Every day | ORAL | 3 refills | Status: DC

## 2017-05-24 ENCOUNTER — Other Ambulatory Visit: Payer: Self-pay | Admitting: Cardiology

## 2017-07-15 ENCOUNTER — Other Ambulatory Visit: Payer: Self-pay | Admitting: Cardiovascular Disease

## 2017-07-17 ENCOUNTER — Encounter: Payer: Self-pay | Admitting: Cardiology

## 2017-07-28 ENCOUNTER — Encounter: Payer: Self-pay | Admitting: Cardiology

## 2017-08-02 NOTE — Progress Notes (Signed)
HPI: FU CAD. Echocardiogram in November of 2014 showed normal LV function. Carotid Dopplers in December of 2014 showed less than 39% bilateral stenosis. ABIs in December of 2014 showed no significant stenosis. Exercise treadmill in Dec 2014 showed no ST changes. Patient continued to have exertional chest pain. He was therefore scheduled for a cardiac catheterization which was performed in January of 2015. This revealed normal left main. 40% mid LAD. 80-90% mid circumflex. The circumflex was occluded distally. The distal vessel filled from collaterals. There was a 90% ostial second obtuse marginal. There was a 90% mid right coronary artery. Ejection fraction normal. Patient had PCI of the right coronary artery and circumflex. Patient continued to have some exertional chest pain. I reviewed the films with Dr Clifton James and medical therapy felt indicated. Angina felt secondary to distal circumflex with inadequate collaterals. Abd Ultrasound 10/15 showed no aneuyrsm. Since I last saw him he has some chest tightness with initiation of his walks which resolves. This is unchanged compared to previous. No other chest pain and denies dyspnea, palpitations or syncope.  Current Outpatient Prescriptions  Medication Sig Dispense Refill  . acetaminophen (TYLENOL) 325 MG tablet Take 2 tablets (650 mg total) by mouth every 4 (four) hours as needed for headache or mild pain.    Marland Kitchen amLODipine (NORVASC) 10 MG tablet TAKE 1 TABLET BY MOUTH DAILY 90 tablet 1  . aspirin EC 81 MG tablet Take 81 mg by mouth daily.     . CRESTOR 20 MG tablet TAKE 1 TABLET BY MOUTH EVERY EVENING 90 tablet 0  . dutasteride (AVODART) 0.5 MG capsule Take 0.5 mg by mouth every other day.    . isosorbide mononitrate (IMDUR) 30 MG 24 hr tablet Take 1 tablet (30 mg total) by mouth daily. 90 tablet 3  . metoprolol succinate (TOPROL-XL) 50 MG 24 hr tablet TAKE 1 TABLET BY MOUTH DAILY WITH OR IMMEDIATELY FOLLOWING A MEAL (Patient taking differently:  TAKE 1/2 TABLET BY MOUTH DAILY WITH OR IMMEDIATELY FOLLOWING A MEAL) 30 tablet 1  . nitroGLYCERIN (NITROSTAT) 0.4 MG SL tablet Place 1 tablet (0.4 mg total) under the tongue every 5 (five) minutes as needed for chest pain. 25 tablet 2   No current facility-administered medications for this visit.      Past Medical History:  Diagnosis Date  . Anginal pain (HCC)   . Arthritis    "maybe back and shoulders" (11/01/2013)  . Back pain   . BPH (benign prostatic hyperplasia)   . Chronic bronchitis (HCC)       . Coronary artery disease 11/02/13   RCA/ CFX DES; LAD 40%, distal CFX 100%, med rx, EF 65%  . Exertional shortness of breath       . Gout attack       . HLD (hyperlipidemia)    "started RX mid December 2014"  . HTN (hypertension)    "started RX mid December 2014"  . PVD (peripheral vascular disease) (HCC) 12/14   mild ICA disease  . RBBB     Past Surgical History:  Procedure Laterality Date  . CERVICAL DISC SURGERY  ~ 2005  . CORONARY ANGIOPLASTY WITH STENT PLACEMENT  11/01/2013   RCA and CFX  . INGUINAL HERNIA REPAIR Left 2000's  . KNEE SURGERY Right 1976   "open; torn cartilage"  . LEFT HEART CATHETERIZATION WITH CORONARY ANGIOGRAM N/A 11/01/2013   Procedure: LEFT HEART CATHETERIZATION WITH CORONARY ANGIOGRAM;  Surgeon: Kathleene Hazel, MD;  Location: [facility] CATH  LAB;  Service: Cardiovascular;  Laterality: N/A;  . TONSILLECTOMY      Social History   Social History  . Marital status: Married    Spouse name: N/A  . Number of children: 3  . Years of education: N/A   Occupational History  .      Construction   Social History Main Topics  . Smoking status: Former Smoker    Packs/day: 0.50    Years: 5.00    Types: Cigarettes  . Smokeless tobacco: Never Used     Comment: 11/01/2013 "stopped smoking in the 1980's"  . Alcohol use 8.4 oz/week    14 Cans of beer per week     Comment: 11/01/2013 "2 beers per night"  . Drug use: No  . Sexual activity: Yes   Other  Topics Concern  . Not on file   Social History Narrative  . No narrative on file    Family History  Problem Relation Age of Onset  . Hypertension Father     ROS: no fevers or chills, productive cough, hemoptysis, dysphasia, odynophagia, melena, hematochezia, dysuria, hematuria, rash, seizure activity, orthopnea, PND, pedal edema, claudication. Remaining systems are negative.  Physical Exam: Well-developed well-nourished in no acute distress.  Skin is warm and dry.  HEENT is normal.  Neck is supple.  Chest is clear to auscultation with normal expansion.  Cardiovascular exam is regular rate and rhythm.  Abdominal exam nontender or distended. No masses palpated. Extremities show no edema. neuro grossly intact  ECG- sinus bradycardia at a rate of 55. Right bundle branch block. personally reviewed  A/P  1 Coronary artery disease-patient symptoms are stable on medications. Plan to continue aspirin, statin, beta blocker, nitrates and amlodipine.  2 hypertension-blood pressure is controlled. Continue present medications. Check potassium and renal function.   3 hyperlipidemia-given documented coronary artery disease I will increase Crestor to 40 mg daily. In 4 weeks check lipids and liver.  Olga MillersBrian Ellasyn Swilling, MD

## 2017-08-09 ENCOUNTER — Other Ambulatory Visit: Payer: Self-pay | Admitting: Cardiology

## 2017-08-11 ENCOUNTER — Ambulatory Visit (INDEPENDENT_AMBULATORY_CARE_PROVIDER_SITE_OTHER): Payer: Commercial Managed Care - PPO | Admitting: Cardiology

## 2017-08-11 ENCOUNTER — Encounter: Payer: Self-pay | Admitting: Cardiology

## 2017-08-11 VITALS — BP 118/60 | HR 55 | Ht 72.0 in | Wt 181.4 lb

## 2017-08-11 DIAGNOSIS — E7800 Pure hypercholesterolemia, unspecified: Secondary | ICD-10-CM | POA: Diagnosis not present

## 2017-08-11 DIAGNOSIS — I10 Essential (primary) hypertension: Secondary | ICD-10-CM | POA: Diagnosis not present

## 2017-08-11 DIAGNOSIS — I2510 Atherosclerotic heart disease of native coronary artery without angina pectoris: Secondary | ICD-10-CM | POA: Diagnosis not present

## 2017-08-11 MED ORDER — ROSUVASTATIN CALCIUM 40 MG PO TABS: 40.0000 mg | ORAL_TABLET | Freq: Every evening | ORAL | 3 refills | Status: DC

## 2017-08-11 NOTE — Patient Instructions (Signed)
Medication Instructions:   INCREASE ROSUVASTATIN TO 40 MG ONCE DAILY= 2 OF THE 20 MG TABLETS ONCE DAILY  Labwork:  Your physician recommends that you RETURN FOR BLOOD WORK IN 4 WEEKS PRIOR TO EATING  Follow-Up:  Your physician wants you to follow-up in: ONE YEAR WITH DR Shelda PalRENSHAW You will receive a reminder letter in the mail two months in advance. If you don't receive a letter, please call our office to schedule the follow-up appointment.   If you need a refill on your cardiac medications before your next appointment, please call your pharmacy.

## 2017-09-15 LAB — HEPATIC FUNCTION PANEL
ALT: 19 IU/L (ref 0–44)
AST: 29 IU/L (ref 0–40)
Albumin: 4.7 g/dL (ref 3.6–4.8)
Alkaline Phosphatase: 55 IU/L (ref 39–117)
BILIRUBIN TOTAL: 1.1 mg/dL (ref 0.0–1.2)
BILIRUBIN, DIRECT: 0.24 mg/dL (ref 0.00–0.40)
TOTAL PROTEIN: 6.7 g/dL (ref 6.0–8.5)

## 2017-09-15 LAB — LIPID PANEL
CHOL/HDL RATIO: 1.9 ratio (ref 0.0–5.0)
Cholesterol, Total: 122 mg/dL (ref 100–199)
HDL: 65 mg/dL (ref >39)
LDL Calculated: 44 mg/dL (ref 0–99)
Triglycerides: 67 mg/dL (ref 0–149)
VLDL Cholesterol Cal: 13 mg/dL (ref 5–40)

## 2017-11-10 ENCOUNTER — Other Ambulatory Visit: Payer: Self-pay | Admitting: Cardiology

## 2017-11-10 ENCOUNTER — Other Ambulatory Visit: Payer: Self-pay | Admitting: Cardiovascular Disease

## 2017-11-30 ENCOUNTER — Other Ambulatory Visit: Payer: Self-pay | Admitting: Cardiology

## 2017-11-30 NOTE — Telephone Encounter (Signed)
Rx(s) sent to pharmacy electronically.  

## 2018-02-14 ENCOUNTER — Other Ambulatory Visit: Payer: Self-pay | Admitting: Cardiology

## 2018-02-14 NOTE — Telephone Encounter (Signed)
REFILL 

## 2018-06-06 ENCOUNTER — Other Ambulatory Visit: Payer: Self-pay | Admitting: Cardiology

## 2018-08-17 NOTE — Progress Notes (Signed)
HPI: FU CAD. Echocardiogram in November of 2014 showed normal LV function. Carotid Dopplers in December of 2014 showed less than 39% bilateral stenosis. ABIs in December of 2014 showed no significant stenosis. Exercise treadmill in Dec 2014 showed no ST changes. Patient continued to have exertional chest pain. He was therefore scheduled for a cardiac catheterization which was performed in January of 2015. This revealed normal left main. 40% mid LAD. 80-90% mid circumflex. The circumflex was occluded distally. The distal vessel filled from collaterals. There was a 90% ostial second obtuse marginal. There was a 90% mid right coronary artery. Ejection fraction normal. Patient had PCI of the right coronary artery and circumflex. Patient continued to have some exertional chest pain. I reviewed the films with Dr Clifton James and medical therapy felt indicated. Angina felt secondary to distal circumflex with inadequate collaterals. Abd Ultrasound 10/15 showed no aneuyrsm. Since I last saw him  he has mild chest tightness when he initiates exercise which is unchanged.  This resolves with continued activities.  No change in his symptoms.  He denies dyspnea, palpitations or syncope.  Current Outpatient Medications  Medication Sig Dispense Refill  . acetaminophen (TYLENOL) 325 MG tablet Take 2 tablets (650 mg total) by mouth every 4 (four) hours as needed for headache or mild pain.    Marland Kitchen amLODipine (NORVASC) 10 MG tablet TAKE 1 TABLET BY MOUTH DAILY 90 tablet 0  . aspirin EC 81 MG tablet Take 81 mg by mouth daily.     Marland Kitchen dutasteride (AVODART) 0.5 MG capsule Take 0.5 mg by mouth every other day.    . isosorbide mononitrate (IMDUR) 30 MG 24 hr tablet TAKE 1 TABLET BY MOUTH DAILY 90 tablet 2  . metoprolol succinate (TOPROL-XL) 50 MG 24 hr tablet Take 25 mg by mouth at bedtime. Take with or immediately following a meal.    . nitroGLYCERIN (NITROSTAT) 0.4 MG SL tablet PLACE 1 TABLET UNDER THE TONGUE EVERY 5 MINUTES AS  NEEDED FOR CHEST PAIN 25 tablet 6  . rosuvastatin (CRESTOR) 40 MG tablet Take 1 tablet (40 mg total) by mouth every evening. 90 tablet 3   No current facility-administered medications for this visit.      Past Medical History:  Diagnosis Date  . Anginal pain (HCC)   . Arthritis    "maybe back and shoulders" (11/01/2013)  . Back pain   . BPH (benign prostatic hyperplasia)   . Chronic bronchitis (HCC)       . Coronary artery disease 11/02/13   RCA/ CFX DES; LAD 40%, distal CFX 100%, med rx, EF 65%  . Exertional shortness of breath       . Gout attack       . HLD (hyperlipidemia)    "started RX mid December 2014"  . HTN (hypertension)    "started RX mid December 2014"  . PVD (peripheral vascular disease) (HCC) 12/14   mild ICA disease  . RBBB     Past Surgical History:  Procedure Laterality Date  . CERVICAL DISC SURGERY  ~ 2005  . CORONARY ANGIOPLASTY WITH STENT PLACEMENT  11/01/2013   RCA and CFX  . INGUINAL HERNIA REPAIR Left 2000's  . KNEE SURGERY Right 1976   "open; torn cartilage"  . LEFT HEART CATHETERIZATION WITH CORONARY ANGIOGRAM N/A 11/01/2013   Procedure: LEFT HEART CATHETERIZATION WITH CORONARY ANGIOGRAM;  Surgeon: Kathleene Hazel, MD;  Location: [facility] CATH LAB;  Service: Cardiovascular;  Laterality: N/A;  . TONSILLECTOMY  Social History   Socioeconomic History  . Marital status: Married    Spouse name: Not on file  . Number of children: 3  . Years of education: Not on file  . Highest education level: Not on file  Occupational History    Comment: Holiday representative  Social Needs  . Financial resource strain: Not on file  . Food insecurity:    Worry: Not on file    Inability: Not on file  . Transportation needs:    Medical: Not on file    Non-medical: Not on file  Tobacco Use  . Smoking status: Former Smoker    Packs/day: 0.50    Years: 5.00    Pack years: 2.50    Types: Cigarettes  . Smokeless tobacco: Never Used  . Tobacco comment:  11/01/2013 "stopped smoking in the 1980's"  Substance and Sexual Activity  . Alcohol use: Yes    Alcohol/week: 14.0 standard drinks    Types: 14 Cans of beer per week    Comment: 11/01/2013 "2 beers per night"  . Drug use: No  . Sexual activity: Yes  Lifestyle  . Physical activity:    Days per week: Not on file    Minutes per session: Not on file  . Stress: Not on file  Relationships  . Social connections:    Talks on phone: Not on file    Gets together: Not on file    Attends religious service: Not on file    Active member of club or organization: Not on file    Attends meetings of clubs or organizations: Not on file    Relationship status: Not on file  . Intimate partner violence:    Fear of current or ex partner: Not on file    Emotionally abused: Not on file    Physically abused: Not on file    Forced sexual activity: Not on file  Other Topics Concern  . Not on file  Social History Narrative  . Not on file    Family History  Problem Relation Age of Onset  . Hypertension Father     ROS: Fatigue but no fevers or chills, productive cough, hemoptysis, dysphasia, odynophagia, melena, hematochezia, dysuria, hematuria, rash, seizure activity, orthopnea, PND, pedal edema, claudication. Remaining systems are negative.  Physical Exam: Well-developed well-nourished in no acute distress.  Skin is warm and dry.  HEENT is normal.  Neck is supple.  Chest is clear to auscultation with normal expansion.  Cardiovascular exam is regular rate and rhythm.  Abdominal exam nontender or distended. No masses palpated. Extremities show no edema. neuro grossly intact  ECG-sinus rhythm at a rate of 62.  Right bundle branch block.  Personally reviewed  A/P  1 coronary artery disease-patient has had no change in symptoms.  Plan to continue medical therapy as outlined in HPI.  Continue aspirin, statin, beta-blocker, amlodipine and nitrates.  2 hypertension-patient's blood pressure is  controlled.  Continue present medications and follow.  3 hyperlipidemia-continue statin.  Check lipids and liver.  Olga Millers, MD

## 2018-08-23 ENCOUNTER — Other Ambulatory Visit: Payer: Self-pay | Admitting: Cardiology

## 2018-08-29 ENCOUNTER — Ambulatory Visit: Payer: Commercial Managed Care - PPO | Admitting: Cardiology

## 2018-08-29 ENCOUNTER — Encounter: Payer: Self-pay | Admitting: Cardiology

## 2018-08-29 VITALS — BP 120/66 | HR 62 | Ht 72.0 in | Wt 186.6 lb

## 2018-08-29 DIAGNOSIS — E7800 Pure hypercholesterolemia, unspecified: Secondary | ICD-10-CM

## 2018-08-29 DIAGNOSIS — I2510 Atherosclerotic heart disease of native coronary artery without angina pectoris: Secondary | ICD-10-CM

## 2018-08-29 DIAGNOSIS — I10 Essential (primary) hypertension: Secondary | ICD-10-CM

## 2018-08-29 LAB — LIPID PANEL
CHOLESTEROL TOTAL: 128 mg/dL (ref 100–199)
Chol/HDL Ratio: 2 ratio (ref 0.0–5.0)
HDL: 64 mg/dL (ref >39)
LDL Calculated: 49 mg/dL (ref 0–99)
TRIGLYCERIDES: 77 mg/dL (ref 0–149)
VLDL CHOLESTEROL CAL: 15 mg/dL (ref 5–40)

## 2018-08-29 LAB — HEPATIC FUNCTION PANEL
ALK PHOS: 58 IU/L (ref 39–117)
ALT: 23 IU/L (ref 0–44)
AST: 27 IU/L (ref 0–40)
Albumin: 4.7 g/dL (ref 3.6–4.8)
BILIRUBIN, DIRECT: 0.26 mg/dL (ref 0.00–0.40)
Bilirubin Total: 1 mg/dL (ref 0.0–1.2)
Total Protein: 6.9 g/dL (ref 6.0–8.5)

## 2018-08-29 NOTE — Patient Instructions (Signed)
Medication Instructions:  NO CHANGE If you need a refill on your cardiac medications before your next appointment, please call your pharmacy.   Lab work: Your physician recommends that you HAVE LAB WORK TODAY If you have labs (blood work) drawn today and your tests are completely normal, you will receive your results only by: . MyChart Message (if you have MyChart) OR . A paper copy in the mail If you have any lab test that is abnormal or we need to change your treatment, we will call you to review the results.  Follow-Up: At [facility], you and your health needs are our priority.  As part of our continuing mission to provide you with exceptional heart care, we have created designated Provider Care Teams.  These Care Teams include your primary Cardiologist (physician) and Advanced Practice Providers (APPs -  Physician Assistants and Nurse Practitioners) who all work together to provide you with the care you need, when you need it. You will need a follow up appointment in 12 months.  Please call our office 2 months in advance to schedule this appointment.  You may see BRIAN CRENSHAW MD or one of the following Advanced Practice Providers on your designated Care Team:   Luke Kilroy, PA-C Krista Kroeger, PA-C . Callie Goodrich, PA-C     

## 2018-09-02 ENCOUNTER — Other Ambulatory Visit: Payer: Self-pay | Admitting: Cardiology

## 2018-09-12 ENCOUNTER — Other Ambulatory Visit: Payer: Self-pay | Admitting: Cardiology

## 2018-09-12 DIAGNOSIS — I2510 Atherosclerotic heart disease of native coronary artery without angina pectoris: Secondary | ICD-10-CM

## 2018-11-25 ENCOUNTER — Other Ambulatory Visit: Payer: Self-pay | Admitting: Cardiology

## 2018-12-20 ENCOUNTER — Telehealth: Payer: Self-pay

## 2018-12-20 NOTE — Telephone Encounter (Signed)
Dr. Jens Som may pt hold ASA for removal of basal cell carcinoma temporal region?   Results to pre-op pool thanks

## 2018-12-20 NOTE — Telephone Encounter (Signed)
Okay to hold aspirin prior to procedure Juan Henderson

## 2018-12-20 NOTE — Telephone Encounter (Signed)
   [facility] Pre-operative Risk Assessment    Request for surgical clearance:  1. What type of surgery is being performed? Basal cell carcinoma removal from temporal region.   2. When is this surgery scheduled? 02/23/2019   3. What type of clearance is required (medical clearance vs. Pharmacy clearance to hold med vs. Both)?  Both  4. Are there any medications that need to be held prior to surgery and how long? Aspirin  5. Practice name and name of physician performing surgery? [facility] - Dr.Szabo   6. What is your office phone number [phone]    7.   What is your office fax number [phone]  8.   Anesthesia type (None, local, MAC, general) ? unknown   Ena Dawley 12/20/2018, 8:51 AM  _________________________________________________________________   (provider comments below)

## 2018-12-21 NOTE — Telephone Encounter (Signed)
   Primary Cardiologist: Olga Millers, MD  Chart reviewed as part of pre-operative protocol coverage. Patient was contacted 12/21/2018 in reference to pre-operative risk assessment for pending surgery as outlined below.  Juan Henderson was last seen on 08/29/2018 by Dr. Deborah Chalk.  Since that day, Juan Henderson has done well with his CAD with no angina and controled HTN.    He may hold Asprin 5 days prior to procedure.  Resume afterwards.  Therefore, based on ACC/AHA guidelines, the patient would be at acceptable risk for the planned procedure without further cardiovascular testing.   I will route this recommendation to the requesting party via Epic fax function and remove from pre-op pool.  Please call with questions.  [name], NP 12/21/2018, 10:57 AM

## 2019-03-13 ENCOUNTER — Other Ambulatory Visit: Payer: Self-pay | Admitting: Cardiology

## 2019-07-10 ENCOUNTER — Other Ambulatory Visit: Payer: Self-pay | Admitting: Cardiology

## 2019-09-14 ENCOUNTER — Other Ambulatory Visit: Payer: Self-pay | Admitting: Cardiology

## 2019-09-15 ENCOUNTER — Encounter: Payer: Self-pay | Admitting: *Deleted

## 2019-09-18 NOTE — Progress Notes (Signed)
Virtual Visit via Video Note   This visit type was conducted due to national recommendations for restrictions regarding the COVID-19 Pandemic (e.g. social distancing) in an effort to limit this patient's exposure and mitigate transmission in our community.  Due to his co-morbid illnesses, this patient is at least at moderate risk for complications without adequate follow up.  This format is felt to be most appropriate for this patient at this time.  All issues noted in this document were discussed and addressed.  A limited physical exam was performed with this format.  Please refer to the patient's chart for his consent to telehealth for [facility].   Date:  09/19/2019   ID:  Yvette Rack, DOB 1950-11-26, MRN [id number]  Patient Location:Home Provider Location: Home  PCP:  Karle Plumber, MD  Cardiologist:  Dr Jens Som  Evaluation Performed:  Follow-Up Visit  Chief Complaint:  FU CAD  History of Present Illness:    FU CAD. Echocardiogram in November of 2014 showed normal LV function. Carotid Dopplers in December of 2014 showed less than 39% bilateral stenosis. ABIs in December of 2014 showed no significant stenosis. Exercise treadmill in Dec 2014 showed no ST changes. Patient continued to have exertional chest pain. He was therefore scheduled for a cardiac catheterization which was performed in January of 2015. This revealed normal left main. 40% mid LAD. 80-90% mid circumflex. The circumflex was occluded distally. The distal vessel filled from collaterals. There was a 90% ostial second obtuse marginal. There was a 90% mid right coronary artery. Ejection fraction normal. Patient had PCI of the right coronary artery and circumflex. Patient continued to have some exertional chest pain. I reviewed the films with Dr Clifton James and medical therapy felt indicated. Angina felt secondary to distal circumflex with inadequate collaterals. Abd Ultrasound 10/15 showed no aneuyrsm. Since I last saw  him  patient has chest tightness when he starts out with vigorous activities that resolves after several seconds.  He does not have this with routine activities or at rest.  It is unchanged compared to previous.  He denies dyspnea or syncope.  The patient does not have symptoms concerning for COVID-19 infection (fever, chills, cough, or new shortness of breath).    Past Medical History:  Diagnosis Date  . Anginal pain (HCC)   . Arthritis    "maybe back and shoulders" (11/01/2013)  . Back pain   . BPH (benign prostatic hyperplasia)   . Chronic bronchitis (HCC)       . Coronary artery disease 11/02/13   RCA/ CFX DES; LAD 40%, distal CFX 100%, med rx, EF 65%  . Exertional shortness of breath       . Gout attack       . HLD (hyperlipidemia)    "started RX mid December 2014"  . HTN (hypertension)    "started RX mid December 2014"  . PVD (peripheral vascular disease) (HCC) 12/14   mild ICA disease  . RBBB    Past Surgical History:  Procedure Laterality Date  . CERVICAL DISC SURGERY  ~ 2005  . CORONARY ANGIOPLASTY WITH STENT PLACEMENT  11/01/2013   RCA and CFX  . INGUINAL HERNIA REPAIR Left 2000's  . KNEE SURGERY Right 1976   "open; torn cartilage"  . LEFT HEART CATHETERIZATION WITH CORONARY ANGIOGRAM N/A 11/01/2013   Procedure: LEFT HEART CATHETERIZATION WITH CORONARY ANGIOGRAM;  Surgeon: Kathleene Hazel, MD;  Location: [facility] CATH LAB;  Service: Cardiovascular;  Laterality: N/A;  . TONSILLECTOMY  Current Meds  Medication Sig  . acetaminophen (TYLENOL) 325 MG tablet Take 2 tablets (650 mg total) by mouth every 4 (four) hours as needed for headache or mild pain.  Marland Kitchen amLODipine (NORVASC) 10 MG tablet TAKE 1 TABLET BY MOUTH DAILY  . aspirin EC 81 MG tablet Take 81 mg by mouth daily.   Marland Kitchen dutasteride (AVODART) 0.5 MG capsule Take 0.5 mg by mouth every other day.  . isosorbide mononitrate (IMDUR) 30 MG 24 hr tablet TAKE 1 TABLET BY MOUTH DAILY  . metoprolol succinate  (TOPROL-XL) 25 MG 24 hr tablet TAKE 1 TABLET BY MOUTH DAILY  . nitroGLYCERIN (NITROSTAT) 0.4 MG SL tablet PLACE 1 TABLET UNDER THE TONGUE EVERY 5 MINUTES AS NEEDED FOR CHEST PAIN UP TO 3 DOSES, IF SYMPTOMS PERSIST CALL 911  . rosuvastatin (CRESTOR) 40 MG tablet Take 1 tablet (40 mg total) by mouth every evening.     Allergies:   Patient has no known allergies.   Social History   Tobacco Use  . Smoking status: Former Smoker    Packs/day: 0.50    Years: 5.00    Pack years: 2.50    Types: Cigarettes  . Smokeless tobacco: Never Used  . Tobacco comment: 11/01/2013 "stopped smoking in the 1980's"  Substance Use Topics  . Alcohol use: Yes    Alcohol/week: 14.0 standard drinks    Types: 14 Cans of beer per week    Comment: 11/01/2013 "2 beers per night"  . Drug use: No     Family Hx: The patient's family history includes Hypertension in his father.  ROS:   Please see the history of present illness.    No Fever, chills  or productive cough All other systems reviewed and are negative.   Recent Lipid Panel Lab Results  Component Value Date/Time   CHOL 128 08/29/2018 10:04 AM   TRIG 77 08/29/2018 10:04 AM   HDL 64 08/29/2018 10:04 AM   CHOLHDL 2.0 08/29/2018 10:04 AM   CHOLHDL 2.3 02/07/2015 09:52 AM   LDLCALC 49 08/29/2018 10:04 AM    Wt Readings from Last 3 Encounters:  09/19/19 185 lb (83.9 kg)  08/29/18 186 lb 9.6 oz (84.6 kg)  08/11/17 181 lb 6.4 oz (82.3 kg)     Objective:    Vital Signs:  BP 134/72   Ht 6' (1.829 m)   Wt 185 lb (83.9 kg)   BMI 25.09 kg/m    VITAL SIGNS:  reviewed NAD Answers questions appropriately Normal affect Remainder of physical examination not performed (telehealth visit; coronavirus pandemic)  ASSESSMENT & PLAN:    1. Coronary artery disease-patient has some chest pain with vigorous activities but not routine activities.  His symptoms are unchanged.  Continue aspirin, statin, beta-blocker, nitrates and amlodipine. 2.  Hypertension-blood pressure controlled.  Continue present medical regimen. 3. Hyperlipidemia-continue statin.  He is scheduled to see his primary care physician in the next 1 month.  I will have his lipids and liver forwarded to [place] following that visit.  COVID-19 Education: The importance of social distancing was discussed today.  Time:   Today, I have spent 16 minutes with the patient with telehealth technology discussing the above problems.     Medication Adjustments/Labs and Tests Ordered: Current medicines are reviewed at length with the patient today.  Concerns regarding medicines are outlined above.   Tests Ordered: No orders of the defined types were placed in this encounter.   Medication Changes: No orders of the defined types were placed in  this encounter.   Follow Up:  Either In Person or Virtual in 1 year(s)  Signed, Olga MillersBrian Chele Cornell, MD  09/19/2019 8:08 AM    [facility]

## 2019-09-19 ENCOUNTER — Encounter: Payer: Self-pay | Admitting: Cardiology

## 2019-09-19 ENCOUNTER — Telehealth (INDEPENDENT_AMBULATORY_CARE_PROVIDER_SITE_OTHER): Payer: Commercial Managed Care - PPO | Admitting: Cardiology

## 2019-09-19 VITALS — BP 134/72 | Ht 72.0 in | Wt 185.0 lb

## 2019-09-19 DIAGNOSIS — E7800 Pure hypercholesterolemia, unspecified: Secondary | ICD-10-CM | POA: Diagnosis not present

## 2019-09-19 DIAGNOSIS — I10 Essential (primary) hypertension: Secondary | ICD-10-CM

## 2019-09-19 DIAGNOSIS — I2510 Atherosclerotic heart disease of native coronary artery without angina pectoris: Secondary | ICD-10-CM | POA: Diagnosis not present

## 2019-09-19 NOTE — Patient Instructions (Signed)
Medication Instructions:  NO CHANGE *If you need a refill on your cardiac medications before your next appointment, please call your pharmacy*  Lab Work: If you have labs (blood work) drawn today and your tests are completely normal, you will receive your results only by: Marland Kitchen MyChart Message (if you have MyChart) OR . A paper copy in the mail If you have any lab test that is abnormal or we need to change your treatment, we will call you to review the results.  Follow-Up: At [facility], you and your health needs are our priority.  As part of our continuing mission to provide you with exceptional heart care, we have created designated Provider Care Teams.  These Care Teams include your primary Cardiologist (physician) and Advanced Practice Providers (APPs -  Physician Assistants and Nurse Practitioners) who all work together to provide you with the care you need, when you need it.  Your next appointment:   12 month(s)  The format for your next appointment:   VIRTUAL OR IN PERSON  Provider:   You may see Kirk Ruths, MD or one of the following Advanced Practice Providers on your designated Care Team:    Kerin Ransom, PA-C  [place], [place]  Coletta Memos, [place]

## 2019-10-03 ENCOUNTER — Other Ambulatory Visit: Payer: Self-pay | Admitting: Cardiology

## 2019-10-03 DIAGNOSIS — I2510 Atherosclerotic heart disease of native coronary artery without angina pectoris: Secondary | ICD-10-CM

## 2019-10-16 ENCOUNTER — Other Ambulatory Visit: Payer: Self-pay | Admitting: Cardiology

## 2019-12-15 ENCOUNTER — Other Ambulatory Visit: Payer: Self-pay | Admitting: Cardiology

## 2020-09-17 ENCOUNTER — Other Ambulatory Visit: Payer: Self-pay | Admitting: Cardiology

## 2020-10-10 NOTE — Progress Notes (Signed)
HPI: FU CAD. Echocardiogram in November of 2014 showed normal LV function. Carotid Dopplers in December of 2014 showed less than 39% bilateral stenosis. ABIs in December of 2014 showed no significant stenosis. Exercise treadmill in Dec 2014 showed no ST changes. Patient continued to have exertional chest pain. He was therefore scheduled for a cardiac catheterization which was performed in January of 2015. This revealed normal left main. 40% mid LAD. 80-90% mid circumflex. The circumflex was occluded distally. The distal vessel filled from collaterals. There was a 90% ostial second obtuse marginal. There was a 90% mid right coronary artery. Ejection fraction normal. Patient had PCI of the right coronary artery and circumflex. Patient continued to have some exertional chest pain. I reviewed the films with Dr Clifton James and medical therapy felt indicated. Angina felt secondary to distal circumflex with inadequate collaterals. Abd Ultrasound 10/15 showed no aneuyrsm. Since I last saw him patient denies dyspnea, palpitations or syncope.  He continues to have occasional chest tightness with vigorous activities relieved with rest.  This is unchanged.  Current Outpatient Medications  Medication Sig Dispense Refill  . acetaminophen (TYLENOL) 325 MG tablet Take 2 tablets (650 mg total) by mouth every 4 (four) hours as needed for headache or mild pain.    Marland Kitchen amLODipine (NORVASC) 10 MG tablet TAKE 1 TABLET BY MOUTH DAILY 90 tablet 2  . aspirin EC 81 MG tablet Take 81 mg by mouth daily.     Marland Kitchen dutasteride (AVODART) 0.5 MG capsule Take 0.5 mg by mouth every other day.    . isosorbide mononitrate (IMDUR) 30 MG 24 hr tablet TAKE 1 TABLET BY MOUTH DAILY 90 tablet 3  . metoprolol succinate (TOPROL-XL) 25 MG 24 hr tablet TAKE 1 TABLET BY MOUTH DAILY 90 tablet 3  . nitroGLYCERIN (NITROSTAT) 0.4 MG SL tablet PLACE 1 TABLET UNDER THE TONGUE EVERY 5 MINUTES AS NEEDED FOR CHEST PAIN UP TO 3 DOSES, IF SYMPTOMS PERSIST CALL  911 25 tablet 6  . rosuvastatin (CRESTOR) 40 MG tablet TAKE 1 TABLET BY MOUTH EVERY EVENING 90 tablet 3   No current facility-administered medications for this visit.     Past Medical History:  Diagnosis Date  . Anginal pain (HCC)   . Arthritis    "maybe back and shoulders" (11/01/2013)  . Back pain   . BPH (benign prostatic hyperplasia)   . Chronic bronchitis (HCC)       . Coronary artery disease 11/02/13   RCA/ CFX DES; LAD 40%, distal CFX 100%, med rx, EF 65%  . Exertional shortness of breath       . Gout attack       . HLD (hyperlipidemia)    "started RX mid December 2014"  . HTN (hypertension)    "started RX mid December 2014"  . PVD (peripheral vascular disease) (HCC) 12/14   mild ICA disease  . RBBB     Past Surgical History:  Procedure Laterality Date  . CERVICAL DISC SURGERY  ~ 2005  . CORONARY ANGIOPLASTY WITH STENT PLACEMENT  11/01/2013   RCA and CFX  . INGUINAL HERNIA REPAIR Left 2000's  . KNEE SURGERY Right 1976   "open; torn cartilage"  . LEFT HEART CATHETERIZATION WITH CORONARY ANGIOGRAM N/A 11/01/2013   Procedure: LEFT HEART CATHETERIZATION WITH CORONARY ANGIOGRAM;  Surgeon: Kathleene Hazel, MD;  Location: [facility] CATH LAB;  Service: Cardiovascular;  Laterality: N/A;  . TONSILLECTOMY      Social History   Socioeconomic History  .  Marital status: Married    Spouse name: Not on file  . Number of children: 3  . Years of education: Not on file  . Highest education level: Not on file  Occupational History    Comment: Construction  Tobacco Use  . Smoking status: Former Smoker    Packs/day: 0.50    Years: 5.00    Pack years: 2.50    Types: Cigarettes  . Smokeless tobacco: Never Used  . Tobacco comment: 11/01/2013 "stopped smoking in the 1980's"  Vaping Use  . Vaping Use: Never used  Substance and Sexual Activity  . Alcohol use: Yes    Alcohol/week: 14.0 standard drinks    Types: 14 Cans of beer per week    Comment: 11/01/2013 "2 beers per  night"  . Drug use: No  . Sexual activity: Yes  Other Topics Concern  . Not on file  Social History Narrative  . Not on file   Social Determinants of Health   Financial Resource Strain: Not on file  Food Insecurity: Not on file  Transportation Needs: Not on file  Physical Activity: Not on file  Stress: Not on file  Social Connections: Not on file  Intimate Partner Violence: Not on file    Family History  Problem Relation Age of Onset  . Hypertension Father     ROS: no fevers or chills, productive cough, hemoptysis, dysphasia, odynophagia, melena, hematochezia, dysuria, hematuria, rash, seizure activity, orthopnea, PND, pedal edema, claudication. Remaining systems are negative.  Physical Exam: Well-developed well-nourished in no acute distress.  Skin is warm and dry.  HEENT is normal.  Neck is supple.  Chest is clear to auscultation with normal expansion.  Cardiovascular exam is regular rate and rhythm.  Abdominal exam nontender or distended. No masses palpated. Extremities show no edema. neuro grossly intact  ECG-sinus bradycardia at a rate of 55, right bundle branch block.  Personally reviewed  A/P  1 coronary artery disease-patient has chest pain with vigorous activities which is unchanged compared to previous.  Plan to continue medical therapy with aspirin, statin.  Also continue antianginals including amlodipine, nitrates and beta-blocker.  2 hypertension-blood pressure is controlled today.  Continue present medications and follow.  3 hyperlipidemia-continue statin.  Olga Millers, MD

## 2020-10-15 ENCOUNTER — Other Ambulatory Visit: Payer: Self-pay | Admitting: Cardiology

## 2020-10-15 DIAGNOSIS — I2510 Atherosclerotic heart disease of native coronary artery without angina pectoris: Secondary | ICD-10-CM

## 2020-10-17 ENCOUNTER — Encounter: Payer: Self-pay | Admitting: Cardiology

## 2020-10-17 ENCOUNTER — Ambulatory Visit: Payer: Commercial Managed Care - PPO | Admitting: Cardiology

## 2020-10-17 ENCOUNTER — Other Ambulatory Visit: Payer: Self-pay

## 2020-10-17 VITALS — BP 130/77 | HR 57 | Ht 72.0 in | Wt 185.8 lb

## 2020-10-17 DIAGNOSIS — E7800 Pure hypercholesterolemia, unspecified: Secondary | ICD-10-CM | POA: Diagnosis not present

## 2020-10-17 DIAGNOSIS — I10 Essential (primary) hypertension: Secondary | ICD-10-CM

## 2020-10-17 DIAGNOSIS — I2510 Atherosclerotic heart disease of native coronary artery without angina pectoris: Secondary | ICD-10-CM

## 2020-10-17 NOTE — Patient Instructions (Signed)

## 2020-10-30 ENCOUNTER — Other Ambulatory Visit: Payer: Self-pay | Admitting: Cardiology

## 2021-06-30 ENCOUNTER — Other Ambulatory Visit: Payer: Self-pay | Admitting: Cardiology

## 2021-11-18 ENCOUNTER — Other Ambulatory Visit: Payer: Self-pay | Admitting: Cardiology

## 2021-11-18 DIAGNOSIS — I2510 Atherosclerotic heart disease of native coronary artery without angina pectoris: Secondary | ICD-10-CM

## 2021-12-12 ENCOUNTER — Other Ambulatory Visit: Payer: Self-pay | Admitting: Cardiology

## 2021-12-15 ENCOUNTER — Other Ambulatory Visit: Payer: Self-pay | Admitting: Cardiology

## 2022-02-25 NOTE — Progress Notes (Signed)
HPI:FU CAD. Echocardiogram in November of 2014 showed normal LV function. Carotid Dopplers in December of 2014 showed less than 39% bilateral stenosis. ABIs in December of 2014 showed no significant stenosis. Exercise treadmill in Dec 2014 showed no ST changes. Patient continued to have exertional chest pain. He was therefore scheduled for a cardiac catheterization which was performed in January of 2015. This revealed normal left main. 40% mid LAD. 80-90% mid circumflex. The circumflex was occluded distally. The distal vessel filled from collaterals. There was a 90% ostial second obtuse marginal. There was a 90% mid right coronary artery. Ejection fraction normal. Patient had PCI of the right coronary artery and circumflex. Patient continued to have some exertional chest pain. I reviewed the films with Dr Clifton James and medical therapy felt indicated. Angina felt secondary to distal circumflex with inadequate collaterals. Abd Ultrasound 10/15 showed no aneuyrsm. Since I last saw him he has chest pain with vigorous walks.  Improves with continued activities.  This is unchanged.  He has no symptoms at rest.  He denies dyspnea or syncope.  Current Outpatient Medications  Medication Sig Dispense Refill   acetaminophen (TYLENOL) 325 MG tablet Take 2 tablets (650 mg total) by mouth every 4 (four) hours as needed for headache or mild pain.     amLODipine (NORVASC) 10 MG tablet TAKE 1 TABLET BY MOUTH DAILY 90 tablet 2   aspirin EC 81 MG tablet Take 81 mg by mouth daily.      dutasteride (AVODART) 0.5 MG capsule Take 0.5 mg by mouth every other day.     isosorbide mononitrate (IMDUR) 30 MG 24 hr tablet Take 1 tablet (30 mg total) by mouth daily. KEEP OV. 90 tablet 0   metoprolol succinate (TOPROL-XL) 25 MG 24 hr tablet Take 1 tablet (25 mg total) by mouth daily. Keep upcoming appointment for future refills. 90 tablet 0   nitroGLYCERIN (NITROSTAT) 0.4 MG SL tablet PLACE 1 TABLET UNDER THE TONGUE EVERY 5  MINUTES AS NEEDED FOR CHEST PAIN UP TO 3 DOSES, IF SYMPTOMS PERSIST CALL 911 25 tablet 6   rosuvastatin (CRESTOR) 40 MG tablet TAKE 1 TABLET BY MOUTH EVERY EVENING 90 tablet 1   tamsulosin (FLOMAX) 0.4 MG CAPS capsule Take 0.4 mg by mouth daily.     No current facility-administered medications for this visit.     Past Medical History:  Diagnosis Date   Anginal pain (HCC)    Arthritis    "maybe back and shoulders" (11/01/2013)   Back pain    BPH (benign prostatic hyperplasia)    Chronic bronchitis (HCC)        Coronary artery disease 11/02/13   RCA/ CFX DES; LAD 40%, distal CFX 100%, med rx, EF 65%   Exertional shortness of breath        Gout attack        HLD (hyperlipidemia)    "started RX mid December 2014"   HTN (hypertension)    "started RX mid December 2014"   PVD (peripheral vascular disease) (HCC) 12/14   mild ICA disease   RBBB     Past Surgical History:  Procedure Laterality Date   CERVICAL DISC SURGERY  ~ 2005   CORONARY ANGIOPLASTY WITH STENT PLACEMENT  11/01/2013   RCA and CFX   INGUINAL HERNIA REPAIR Left 2000's   KNEE SURGERY Right 1976   "open; torn cartilage"   LEFT HEART CATHETERIZATION WITH CORONARY ANGIOGRAM N/A 11/01/2013   Procedure: LEFT HEART CATHETERIZATION WITH CORONARY  ANGIOGRAM;  Surgeon: Kathleene Hazel, MD;  Location: [facility] CATH LAB;  Service: Cardiovascular;  Laterality: N/A;   TONSILLECTOMY      Social History   Socioeconomic History   Marital status: Married    Spouse name: Not on file   Number of children: 3   Years of education: Not on file   Highest education level: Not on file  Occupational History    Comment: Holiday representative  Tobacco Use   Smoking status: Former    Packs/day: 0.50    Years: 5.00    Pack years: 2.50    Types: Cigarettes   Smokeless tobacco: Never   Tobacco comments:    11/01/2013 "stopped smoking in the 1980's"  Vaping Use   Vaping Use: Never used  Substance and Sexual Activity   Alcohol use: Yes     Alcohol/week: 14.0 standard drinks    Types: 14 Cans of beer per week    Comment: 11/01/2013 "2 beers per night"   Drug use: No   Sexual activity: Yes  Other Topics Concern   Not on file  Social History Narrative   Not on file   Social Determinants of Health   Financial Resource Strain: Not on file  Food Insecurity: Not on file  Transportation Needs: Not on file  Physical Activity: Not on file  Stress: Not on file  Social Connections: Not on file  Intimate Partner Violence: Not on file    Family History  Problem Relation Age of Onset   Hypertension Father     ROS: no fevers or chills, productive cough, hemoptysis, dysphasia, odynophagia, melena, hematochezia, dysuria, hematuria, rash, seizure activity, orthopnea, PND, pedal edema, claudication. Remaining systems are negative.  Physical Exam: Well-developed well-nourished in no acute distress.  Skin is warm and dry.  HEENT is normal.  Neck is supple.  Chest is clear to auscultation with normal expansion.  Cardiovascular exam is regular rate and rhythm.  Abdominal exam nontender or distended. No masses palpated. Extremities show no edema. neuro grossly intact  ECG-sinus bradycardia at a rate of 57, right bundle branch block.  Personally reviewed  A/P  1 coronary artery disease-patient continues to have chest pain with vigorous activities but this is unchanged compared to previous.  He does not have this with routine activities.  We will continue medical therapy with aspirin, statin, amlodipine, nitrates and beta-blockade.  2 hypertension-patient's blood pressure is controlled.  Continue present medical regimen.  3 hyperlipidemia-continue statin.  Olga Millers, MD

## 2022-03-05 ENCOUNTER — Ambulatory Visit: Payer: Commercial Managed Care - PPO | Admitting: Cardiology

## 2022-03-05 ENCOUNTER — Encounter: Payer: Self-pay | Admitting: Cardiology

## 2022-03-05 VITALS — BP 136/72 | HR 57 | Ht 72.0 in | Wt 177.0 lb

## 2022-03-05 DIAGNOSIS — I2510 Atherosclerotic heart disease of native coronary artery without angina pectoris: Secondary | ICD-10-CM | POA: Diagnosis not present

## 2022-03-05 DIAGNOSIS — E7800 Pure hypercholesterolemia, unspecified: Secondary | ICD-10-CM | POA: Diagnosis not present

## 2022-03-05 DIAGNOSIS — I10 Essential (primary) hypertension: Secondary | ICD-10-CM

## 2022-03-05 NOTE — Patient Instructions (Signed)

## 2022-03-20 ENCOUNTER — Other Ambulatory Visit: Payer: Self-pay

## 2022-03-20 MED ORDER — ISOSORBIDE MONONITRATE ER 30 MG PO TB24: 30.0000 mg | ORAL_TABLET | Freq: Every day | ORAL | 3 refills | Status: DC

## 2022-03-20 MED ORDER — METOPROLOL SUCCINATE ER 25 MG PO TB24: 25.0000 mg | ORAL_TABLET | Freq: Every day | ORAL | 3 refills | Status: DC

## 2022-03-30 ENCOUNTER — Other Ambulatory Visit: Payer: Self-pay | Admitting: Cardiology

## 2022-06-08 ENCOUNTER — Other Ambulatory Visit: Payer: Self-pay | Admitting: Cardiology

## 2022-06-08 DIAGNOSIS — I2510 Atherosclerotic heart disease of native coronary artery without angina pectoris: Secondary | ICD-10-CM

## 2022-12-22 DIAGNOSIS — R350 Frequency of micturition: Secondary | ICD-10-CM | POA: Diagnosis not present

## 2022-12-22 DIAGNOSIS — N401 Enlarged prostate with lower urinary tract symptoms: Secondary | ICD-10-CM | POA: Diagnosis not present

## 2022-12-22 DIAGNOSIS — R3912 Poor urinary stream: Secondary | ICD-10-CM | POA: Diagnosis not present

## 2022-12-23 ENCOUNTER — Telehealth: Payer: Self-pay | Admitting: Cardiology

## 2022-12-23 NOTE — Telephone Encounter (Signed)
Spoke with kim ramer cma at the urology office, she reports the patient has BPH with obstruction and this medication will help the prostrate shrink and help him be able to void. Aware will forward to dr Stanford Breed to see if the isosorbide is something that can be stopped.

## 2022-12-23 NOTE — Telephone Encounter (Signed)
Pt c/o medication issue:  1. Name of Medication: Tadafil   2. How are you currently taking this medication (dosage and times per day)? 5 mg daily   3. Are you having a reaction (difficulty breathing--STAT)? no  4. What is your medication issue? Office calling to see if it okay for patient to take medication. Dr. Arnoldo Morale Please advise

## 2022-12-23 NOTE — Telephone Encounter (Signed)
Call to verify medication and dosage.  No answer at Urology.  Will try again after recommendations from provider.

## 2022-12-23 NOTE — Telephone Encounter (Signed)
Patient is on isosorbide. The use of 5PDE inhibitors are contraindicated with nitrates due to risk of hypotension. He would have to come off the isosorbide to use. I will defer to cardiologist if that is even a possibility.

## 2022-12-23 NOTE — Telephone Encounter (Signed)
Will forward to pharm d to review 

## 2022-12-24 NOTE — Telephone Encounter (Signed)
Called pt, relayed Dr. Jacalyn Lefevre message. He was shocked and appreciative of the call and recommendation.

## 2022-12-24 NOTE — Telephone Encounter (Signed)
Spoke with kim, aware okay to start medications once isosorbide stopped.

## 2022-12-24 NOTE — Telephone Encounter (Signed)
Patient was returning call. Please advise ?

## 2022-12-24 NOTE — Telephone Encounter (Signed)
Left message for pt to call.

## 2022-12-28 ENCOUNTER — Telehealth: Payer: Self-pay | Admitting: Cardiology

## 2022-12-31 ENCOUNTER — Other Ambulatory Visit: Payer: Self-pay | Admitting: *Deleted

## 2022-12-31 MED ORDER — AMLODIPINE BESYLATE 10 MG PO TABS: 10.0000 mg | ORAL_TABLET | Freq: Every day | ORAL | 0 refills | Status: DC

## 2022-12-31 NOTE — Telephone Encounter (Signed)
*  STAT* If patient is at the pharmacy, call can be transferred to refill team.   1. Which medications need to be refilled? (please list name of each medication and dose if known) amLODipine (NORVASC) 10 MG tablet   2. Which pharmacy/location (including street and city if local pharmacy) is medication to be sent to? [place], [place]   3. Do they need a 30 day or 90 day supply? 90 day  Patient is out medication

## 2022-12-31 NOTE — Telephone Encounter (Signed)
Rx has been sent in as requested.  

## 2023-02-23 DIAGNOSIS — H02052 Trichiasis without entropian right lower eyelid: Secondary | ICD-10-CM | POA: Diagnosis not present

## 2023-02-23 DIAGNOSIS — Z961 Presence of intraocular lens: Secondary | ICD-10-CM | POA: Diagnosis not present

## 2023-02-23 DIAGNOSIS — H43811 Vitreous degeneration, right eye: Secondary | ICD-10-CM | POA: Diagnosis not present

## 2023-02-23 DIAGNOSIS — H35372 Puckering of macula, left eye: Secondary | ICD-10-CM | POA: Diagnosis not present

## 2023-03-08 DIAGNOSIS — I10 Essential (primary) hypertension: Secondary | ICD-10-CM | POA: Diagnosis not present

## 2023-03-08 DIAGNOSIS — Z79899 Other long term (current) drug therapy: Secondary | ICD-10-CM | POA: Diagnosis not present

## 2023-03-08 DIAGNOSIS — I2510 Atherosclerotic heart disease of native coronary artery without angina pectoris: Secondary | ICD-10-CM | POA: Diagnosis not present

## 2023-03-08 DIAGNOSIS — E7800 Pure hypercholesterolemia, unspecified: Secondary | ICD-10-CM | POA: Diagnosis not present

## 2023-03-08 DIAGNOSIS — E559 Vitamin D deficiency, unspecified: Secondary | ICD-10-CM | POA: Diagnosis not present

## 2023-03-25 ENCOUNTER — Other Ambulatory Visit: Payer: Self-pay | Admitting: Cardiology

## 2023-04-21 ENCOUNTER — Other Ambulatory Visit: Payer: Self-pay | Admitting: Cardiology

## 2023-06-08 DIAGNOSIS — E7800 Pure hypercholesterolemia, unspecified: Secondary | ICD-10-CM | POA: Diagnosis not present

## 2023-06-08 DIAGNOSIS — I2510 Atherosclerotic heart disease of native coronary artery without angina pectoris: Secondary | ICD-10-CM | POA: Diagnosis not present

## 2023-06-08 DIAGNOSIS — Z131 Encounter for screening for diabetes mellitus: Secondary | ICD-10-CM | POA: Diagnosis not present

## 2023-06-08 DIAGNOSIS — Z0000 Encounter for general adult medical examination without abnormal findings: Secondary | ICD-10-CM | POA: Diagnosis not present

## 2023-06-08 DIAGNOSIS — I10 Essential (primary) hypertension: Secondary | ICD-10-CM | POA: Diagnosis not present

## 2023-06-22 ENCOUNTER — Other Ambulatory Visit: Payer: Self-pay | Admitting: Cardiology

## 2023-06-22 ENCOUNTER — Other Ambulatory Visit: Payer: Self-pay | Admitting: *Deleted

## 2023-06-22 DIAGNOSIS — I2510 Atherosclerotic heart disease of native coronary artery without angina pectoris: Secondary | ICD-10-CM

## 2023-06-22 MED ORDER — AMLODIPINE BESYLATE 10 MG PO TABS: 10.0000 mg | ORAL_TABLET | Freq: Every day | ORAL | 0 refills | Status: DC

## 2023-06-25 NOTE — Progress Notes (Signed)
HPI: FU CAD. Echocardiogram in November of 2014 showed normal LV function. Carotid Dopplers in December of 2014 showed less than 39% bilateral stenosis. ABIs in December of 2014 showed no significant stenosis. Exercise treadmill in Dec 2014 showed no ST changes. Patient continued to have exertional chest pain. He was therefore scheduled for a cardiac catheterization which was performed in January of 2015. This revealed normal left main. 40% mid LAD. 80-90% mid circumflex. The circumflex was occluded distally. The distal vessel filled from collaterals. There was a 90% ostial second obtuse marginal. There was a 90% mid right coronary artery. Ejection fraction normal. Patient had PCI of the right coronary artery and circumflex. Patient continued to have some exertional chest pain. I reviewed the films with Dr Clifton James and medical therapy felt indicated. Angina felt secondary to distal circumflex with inadequate collaterals. Abd Ultrasound 10/15 showed no aneuyrsm. Since I last saw him he occasionally has chest tightness when he starts his walks which is unchanged.  This resolves with continued activities.  He does not have any symptoms at rest.  There is no dyspnea, pedal edema or syncope.  Current Outpatient Medications  Medication Sig Dispense Refill   acetaminophen (TYLENOL) 325 MG tablet Take 2 tablets (650 mg total) by mouth every 4 (four) hours as needed for headache or mild pain.     amLODipine (NORVASC) 10 MG tablet Take 1 tablet (10 mg total) by mouth daily. 90 tablet 0   aspirin EC 81 MG tablet Take 81 mg by mouth daily.      dutasteride (AVODART) 0.5 MG capsule Take 0.5 mg by mouth every other day.     metoprolol succinate (TOPROL-XL) 25 MG 24 hr tablet TAKE 1 TABLET BY MOUTH DAILY 90 tablet 3   nitroGLYCERIN (NITROSTAT) 0.4 MG SL tablet PLACE 1 TABLET UNDER THE TONGUE EVERY 5 MINUTES AS NEEDED FOR CHEST PAIN UP TO 3 DOSES, IF SYMPTOMS PERSIST CALL 911 25 tablet 6   rosuvastatin (CRESTOR)  40 MG tablet TAKE 1 TABLET BY MOUTH EVERY EVENING 30 tablet 0   tamsulosin (FLOMAX) 0.4 MG CAPS capsule Take 0.4 mg by mouth daily.     No current facility-administered medications for this visit.     Past Medical History:  Diagnosis Date   Anginal pain (HCC)    Arthritis    "maybe back and shoulders" (11/01/2013)   Back pain    BPH (benign prostatic hyperplasia)    Chronic bronchitis (HCC)        Coronary artery disease 11/02/13   RCA/ CFX DES; LAD 40%, distal CFX 100%, med rx, EF 65%   Exertional shortness of breath        Gout attack        HLD (hyperlipidemia)    "started RX mid December 2014"   HTN (hypertension)    "started RX mid December 2014"   PVD (peripheral vascular disease) (HCC) 12/14   mild ICA disease   RBBB     Past Surgical History:  Procedure Laterality Date   CERVICAL DISC SURGERY  ~ 2005   CORONARY ANGIOPLASTY WITH STENT PLACEMENT  11/01/2013   RCA and CFX   INGUINAL HERNIA REPAIR Left 2000's   KNEE SURGERY Right 1976   "open; torn cartilage"   LEFT HEART CATHETERIZATION WITH CORONARY ANGIOGRAM N/A 11/01/2013   Procedure: LEFT HEART CATHETERIZATION WITH CORONARY ANGIOGRAM;  Surgeon: Kathleene Hazel, MD;  Location: [facility] CATH LAB;  Service: Cardiovascular;  Laterality: N/A;   TONSILLECTOMY  Social History   Socioeconomic History   Marital status: Married    Spouse name: Not on file   Number of children: 3   Years of education: Not on file   Highest education level: Not on file  Occupational History    Comment: Holiday representative  Tobacco Use   Smoking status: Former    Current packs/day: 0.50    Average packs/day: 0.5 packs/day for 5.0 years (2.5 ttl pk-yrs)    Types: Cigarettes   Smokeless tobacco: Never   Tobacco comments:    11/01/2013 "stopped smoking in the 1980's"  Vaping Use   Vaping status: Never Used  Substance and Sexual Activity   Alcohol use: Yes    Alcohol/week: 14.0 standard drinks of alcohol    Types: 14 Cans of beer  per week    Comment: 11/01/2013 "2 beers per night"   Drug use: No   Sexual activity: Yes  Other Topics Concern   Not on file  Social History Narrative   Not on file   Social Determinants of Health   Financial Resource Strain: Not on file  Food Insecurity: Not on file  Transportation Needs: Not on file  Physical Activity: Not on file  Stress: Not on file  Social Connections: Not on file  Intimate Partner Violence: Not on file    Family History  Problem Relation Age of Onset   Hypertension Father     ROS: no fevers or chills, productive cough, hemoptysis, dysphasia, odynophagia, melena, hematochezia, dysuria, hematuria, rash, seizure activity, orthopnea, PND, pedal edema, claudication. Remaining systems are negative.  Physical Exam: Well-developed well-nourished in no acute distress.  Skin is warm and dry.  HEENT is normal.  Neck is supple.  Chest is clear to auscultation with normal expansion.  Cardiovascular exam is regular rate and rhythm.  Abdominal exam nontender or distended. No masses palpated. Extremities show no edema. neuro grossly intact  EKG Interpretation Date/Time:  Tuesday July 06 2023 08:47:50 EDT Ventricular Rate:  58 PR Interval:  174 QRS Duration:  136 QT Interval:  416 QTC Calculation: 408 R Axis:   78  Text Interpretation: Sinus bradycardia Right bundle branch block When compared with ECG of 02-Nov-2013 05:31, No significant change was found Confirmed by Olga Millers ([id number]) on 07/06/2023 9:01:52 AM    A/P  1 coronary artery disease-patient symptoms are unchanged.  He has some chest discomfort with vigorous activities but not routine activities.  We will continue with medical therapy including aspirin, statin, amlodipine, nitrates and beta-blocker.  2 hyperlipidemia-continue statin.  Will have his most recent lipids and liver forwarded to [place] from primary care.  3 hypertension-blood pressure controlled.  Continue present  medications.  Olga Millers, MD

## 2023-07-06 ENCOUNTER — Encounter: Payer: Self-pay | Admitting: Cardiology

## 2023-07-06 ENCOUNTER — Ambulatory Visit: Payer: Medicare Other | Attending: Cardiology | Admitting: Cardiology

## 2023-07-06 VITALS — BP 126/66 | HR 58 | Ht 72.0 in | Wt 170.8 lb

## 2023-07-06 DIAGNOSIS — I25118 Atherosclerotic heart disease of native coronary artery with other forms of angina pectoris: Secondary | ICD-10-CM | POA: Diagnosis not present

## 2023-07-06 DIAGNOSIS — I10 Essential (primary) hypertension: Secondary | ICD-10-CM

## 2023-07-06 DIAGNOSIS — E7800 Pure hypercholesterolemia, unspecified: Secondary | ICD-10-CM

## 2023-07-06 NOTE — Patient Instructions (Signed)

## 2023-07-23 ENCOUNTER — Telehealth: Payer: Self-pay | Admitting: Cardiology

## 2023-07-23 ENCOUNTER — Other Ambulatory Visit: Payer: Self-pay | Admitting: Cardiology

## 2023-07-23 DIAGNOSIS — I2510 Atherosclerotic heart disease of native coronary artery without angina pectoris: Secondary | ICD-10-CM

## 2023-07-23 DIAGNOSIS — I25118 Atherosclerotic heart disease of native coronary artery with other forms of angina pectoris: Secondary | ICD-10-CM

## 2023-07-23 NOTE — Telephone Encounter (Signed)
Pt c/o medication issue:  1. Name of Medication:   Tadalafil  2. How are you currently taking this medication (dosage and times per day)?   As prescribed  3. Are you having a reaction (difficulty breathing--STAT)?   4. What is your medication issue?   Patient stated he is out of this medication now and wants to know if he can go back on  isosorbide mononitrate (IMDUR) 30 MG 24 hr tablet  until he gets things squared away.

## 2023-07-23 NOTE — Telephone Encounter (Signed)
Called pt. He states he wants to know can he restart Imdur because he is out of Tadalafil. "I did not see a difference when I was taking it so I might just stay off of it., I'm not sure."

## 2023-07-26 MED ORDER — ISOSORBIDE MONONITRATE ER 30 MG PO TB24: 30.0000 mg | ORAL_TABLET | Freq: Every day | ORAL | Status: DC

## 2023-07-26 NOTE — Telephone Encounter (Signed)
Spoke with pt, Aware of dr Ludwig Clarks recommendations. Patient voiced understanding of the interaction of medications. He will let us know when a refill is needed.

## 2023-09-07 DIAGNOSIS — E559 Vitamin D deficiency, unspecified: Secondary | ICD-10-CM | POA: Diagnosis not present

## 2023-09-07 DIAGNOSIS — I2510 Atherosclerotic heart disease of native coronary artery without angina pectoris: Secondary | ICD-10-CM | POA: Diagnosis not present

## 2023-09-07 DIAGNOSIS — E7800 Pure hypercholesterolemia, unspecified: Secondary | ICD-10-CM | POA: Diagnosis not present

## 2023-09-07 DIAGNOSIS — I10 Essential (primary) hypertension: Secondary | ICD-10-CM | POA: Diagnosis not present

## 2023-09-07 DIAGNOSIS — Z131 Encounter for screening for diabetes mellitus: Secondary | ICD-10-CM | POA: Diagnosis not present

## 2023-09-07 DIAGNOSIS — Z79899 Other long term (current) drug therapy: Secondary | ICD-10-CM | POA: Diagnosis not present

## 2023-09-23 ENCOUNTER — Other Ambulatory Visit: Payer: Self-pay

## 2023-09-23 MED ORDER — AMLODIPINE BESYLATE 10 MG PO TABS: 10.0000 mg | ORAL_TABLET | Freq: Every day | ORAL | 2 refills | Status: DC

## 2023-11-24 ENCOUNTER — Other Ambulatory Visit: Payer: Self-pay | Admitting: Cardiology

## 2023-11-24 DIAGNOSIS — I25118 Atherosclerotic heart disease of native coronary artery with other forms of angina pectoris: Secondary | ICD-10-CM

## 2024-06-08 ENCOUNTER — Other Ambulatory Visit: Payer: Self-pay | Admitting: Cardiology

## 2024-06-19 ENCOUNTER — Other Ambulatory Visit: Payer: Self-pay | Admitting: Cardiology

## 2024-08-02 ENCOUNTER — Other Ambulatory Visit: Payer: Self-pay | Admitting: Cardiology

## 2024-08-02 DIAGNOSIS — I2510 Atherosclerotic heart disease of native coronary artery without angina pectoris: Secondary | ICD-10-CM

## 2024-08-30 NOTE — Progress Notes (Signed)
 HPI: FU CAD. Echocardiogram in November of 2014 showed normal LV function. Carotid Dopplers in December of 2014 showed less than 39% bilateral stenosis. ABIs in December of 2014 showed no significant stenosis. Exercise treadmill in Dec 2014 showed no ST changes. Patient continued to have exertional chest pain. He was therefore scheduled for a cardiac catheterization which was performed in January of 2015. This revealed normal left main. 40% mid LAD. 80-90% mid circumflex. The circumflex was occluded distally. The distal vessel filled from collaterals. There was a 90% ostial second obtuse marginal. There was a 90% mid right coronary artery. Ejection fraction normal. Patient had PCI of the right coronary artery and circumflex. Patient continued to have some exertional chest pain. I reviewed the films with Dr Verlin and medical therapy felt indicated. Angina felt secondary to distal circumflex with inadequate collaterals. Abd Ultrasound 10/15 showed no aneuyrsm. Since I last saw him patient has chest pain with vigorous activities but not routine activities.  Symptoms are unchanged compared to previous.  He denies increased dyspnea or syncope.  Current Outpatient Medications  Medication Sig Dispense Refill   acetaminophen  (TYLENOL ) 325 MG tablet Take 2 tablets (650 mg total) by mouth every 4 (four) hours as needed for headache or mild pain.     amLODipine  (NORVASC ) 10 MG tablet TAKE 1 TABLET BY MOUTH DAILY 90 tablet 2   aspirin  EC 81 MG tablet Take 81 mg by mouth daily.      dutasteride (AVODART) 0.5 MG capsule Take 0.5 mg by mouth every other day.     isosorbide  mononitrate (IMDUR ) 30 MG 24 hr tablet TAKE 1 TABLET BY MOUTH DAILY 90 tablet 3   metoprolol  succinate (TOPROL -XL) 25 MG 24 hr tablet TAKE 1 TABLET BY MOUTH DAILY 90 tablet 0   nitroGLYCERIN  (NITROSTAT ) 0.4 MG SL tablet PLACE 1 TABLET UNDER THE TONGUE EVERY 5 MINUTES AS NEEDED FOR CHEST PAIN UP TO 3 DOSES, IF SYMPTOMS PERSIST CALL 911 25  tablet 6   rosuvastatin  (CRESTOR ) 40 MG tablet TAKE 1 TABLET BY MOUTH EVERY EVENING 90 tablet 3   tamsulosin (FLOMAX) 0.4 MG CAPS capsule Take 0.4 mg by mouth daily.     No current facility-administered medications for this visit.     Past Medical History:  Diagnosis Date   Anginal pain    Arthritis    maybe back and shoulders (11/01/2013)   Back pain    BPH (benign prostatic hyperplasia)    Chronic bronchitis (HCC)        Coronary artery disease 11/02/13   RCA/ CFX DES; LAD 40%, distal CFX 100%, med rx, EF 65%   Exertional shortness of breath        Gout attack        HLD (hyperlipidemia)    started RX mid December 2014   HTN (hypertension)    started RX mid December 2014   PVD (peripheral vascular disease) 12/14   mild ICA disease   RBBB     Past Surgical History:  Procedure Laterality Date   CERVICAL DISC SURGERY  ~ 2005   CORONARY ANGIOPLASTY WITH STENT PLACEMENT  11/01/2013   RCA and CFX   INGUINAL HERNIA REPAIR Left 2000's   KNEE SURGERY Right 1976   open; torn cartilage   LEFT HEART CATHETERIZATION WITH CORONARY ANGIOGRAM N/A 11/01/2013   Procedure: LEFT HEART CATHETERIZATION WITH CORONARY ANGIOGRAM;  Surgeon: Lonni JONETTA Verlin, MD;  Location: [facility] CATH LAB;  Service: Cardiovascular;  Laterality: N/A;  TONSILLECTOMY      Social History   Socioeconomic History   Marital status: Married    Spouse name: Not on file   Number of children: 3   Years of education: Not on file   Highest education level: Not on file  Occupational History    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  Tobacco Use   Smoking status: Former    Current packs/day: 0.50    Average packs/day: 0.5 packs/day for 5.0 years (2.5 ttl pk-yrs)    Types: Cigarettes   Smokeless tobacco: Never   Tobacco comments:    11/01/2013 stopped smoking in the 1980's  Vaping Use   Vaping status: Never Used  Substance and Sexual Activity   Alcohol use: Yes    Alcohol/week: 14.0 standard drinks of alcohol     Types: 14 Cans of beer per week    Comment: 11/01/2013 2 beers per night   Drug use: No   Sexual activity: Yes  Other Topics Concern   Not on file  Social History Narrative   Not on file   Social Drivers of Health   Financial Resource Strain: Not on file  Food Insecurity: Not on file  Transportation Needs: Not on file  Physical Activity: Not on file  Stress: Not on file  Social Connections: Not on file  Intimate Partner Violence: Not on file    Family History  Problem Relation Age of Onset   Hypertension Father     ROS: no fevers or chills, productive cough, hemoptysis, dysphasia, odynophagia, melena, hematochezia, dysuria, hematuria, rash, seizure activity, orthopnea, PND, pedal edema, claudication. Remaining systems are negative.  Physical Exam: Well-developed well-nourished in no acute distress.  Skin is warm and dry.  HEENT is normal.  Neck is supple.  Chest is clear to auscultation with normal expansion.  Cardiovascular exam is regular rate and rhythm.  Abdominal exam nontender or distended. No masses palpated. Extremities show no edema. neuro grossly intact  EKG Interpretation Date/Time:  Monday September 11 2024 08:14:15 EST Ventricular Rate:  69 PR Interval:  162 QRS Duration:  142 QT Interval:  400 QTC Calculation: 428 R Axis:   88  Text Interpretation: Normal sinus rhythm Right bundle branch block Confirmed by Pietro Rogue ([id number]) on 09/11/2024 8:16:56 AM    A/P  1 coronary artery disease-patient's symptoms remain unchanged.  Patient can have some chest discomfort with vigorous activities but not typically with routine activities.  Plan to continue medical therapy with aspirin , statin, beta-blocker, nitrates and amlodipine .  2 hypertension-patient's blood pressure is controlled.  Continue present medical regimen.  3 hyperlipidemia-continue statin.  Rogue Pietro, MD

## 2024-09-11 ENCOUNTER — Ambulatory Visit: Attending: Cardiology | Admitting: Cardiology

## 2024-09-11 ENCOUNTER — Encounter: Payer: Self-pay | Admitting: Cardiology

## 2024-09-11 VITALS — BP 120/66 | HR 69 | Ht 72.0 in | Wt 171.0 lb

## 2024-09-11 DIAGNOSIS — E7800 Pure hypercholesterolemia, unspecified: Secondary | ICD-10-CM

## 2024-09-11 DIAGNOSIS — I10 Essential (primary) hypertension: Secondary | ICD-10-CM | POA: Diagnosis not present

## 2024-09-11 DIAGNOSIS — I2510 Atherosclerotic heart disease of native coronary artery without angina pectoris: Secondary | ICD-10-CM | POA: Diagnosis not present

## 2024-09-11 MED ORDER — NITROGLYCERIN 0.4 MG SL SUBL: 0.4000 mg | SUBLINGUAL_TABLET | SUBLINGUAL | 3 refills | Status: AC | PRN

## 2024-09-11 NOTE — Patient Instructions (Signed)
 Medication Instructions:   Your physician recommends that you continue on your current medications as directed. Please refer to the Current Medication list given to you today.  *If you need a refill on your cardiac medications before your next appointment, please call your pharmacy*   Lab Work: NONE ORDERED  TODAY    If you have labs (blood work) drawn today and your tests are completely normal, you will receive your results only by: MyChart Message (if you have MyChart) OR A paper copy in the mail If you have any lab test that is abnormal or we need to change your treatment, we will call you to review the results.    Testing/Procedures: NONE ORDERED  TODAY    Follow-Up: At [facility], you and your health needs are our priority.  As part of our continuing mission to provide you with exceptional heart care, our providers are all part of one team.  This team includes your primary Cardiologist (physician) and Advanced Practice Providers or APPs (Physician Assistants and Nurse Practitioners) who all work together to provide you with the care you need, when you need it.  Your next appointment:   1 year(s)  Provider:   Redell Shallow, MD    We recommend signing up for the patient portal called MyChart.  Sign up information is provided on this After Visit Summary.  MyChart is used to connect with patients for Virtual Visits (Telemedicine).  Patients are able to view lab/test results, encounter notes, upcoming appointments, etc.  Non-urgent messages can be sent to your provider as well.   To learn more about what you can do with MyChart, go to [email or web address].   Other Instructions

## 2024-10-07 ENCOUNTER — Other Ambulatory Visit: Payer: Self-pay | Admitting: Cardiology
